# Patient Record
Sex: Female | Born: 1950 | Race: White | Hispanic: No | Marital: Married | Smoking: Never smoker
Health system: Southern US, Community
[De-identification: ages and names within clinical notes are randomized; demographics above are authoritative.]

## PROBLEM LIST (undated history)

## (undated) DIAGNOSIS — J302 Other seasonal allergic rhinitis: Secondary | ICD-10-CM

## (undated) DIAGNOSIS — IMO0002 Reserved for concepts with insufficient information to code with codable children: Secondary | ICD-10-CM

## (undated) DIAGNOSIS — M549 Dorsalgia, unspecified: Secondary | ICD-10-CM

## (undated) DIAGNOSIS — I10 Essential (primary) hypertension: Secondary | ICD-10-CM

## (undated) DIAGNOSIS — G8929 Other chronic pain: Secondary | ICD-10-CM

## (undated) DIAGNOSIS — C801 Malignant (primary) neoplasm, unspecified: Secondary | ICD-10-CM

## (undated) DIAGNOSIS — J189 Pneumonia, unspecified organism: Secondary | ICD-10-CM

## (undated) HISTORY — PX: ABDOMINAL HYSTERECTOMY: SHX81

## (undated) HISTORY — PX: TONSILLECTOMY: SUR1361

## (undated) HISTORY — PX: KNEE SURGERY: SHX244

## (undated) HISTORY — PX: HAND SURGERY: SHX662

---

## 2003-08-12 ENCOUNTER — Emergency Department (HOSPITAL_COMMUNITY): Admission: AD | Admit: 2003-08-12 | Discharge: 2003-08-13 | Payer: Self-pay | Admitting: Emergency Medicine

## 2004-09-20 ENCOUNTER — Emergency Department (HOSPITAL_COMMUNITY): Admission: EM | Admit: 2004-09-20 | Discharge: 2004-09-21 | Payer: Self-pay | Admitting: Emergency Medicine

## 2006-09-06 ENCOUNTER — Emergency Department (HOSPITAL_COMMUNITY): Admission: EM | Admit: 2006-09-06 | Discharge: 2006-09-06 | Payer: Self-pay | Admitting: Emergency Medicine

## 2007-01-24 ENCOUNTER — Emergency Department (HOSPITAL_COMMUNITY): Admission: EM | Admit: 2007-01-24 | Discharge: 2007-01-24 | Payer: Self-pay | Admitting: Emergency Medicine

## 2008-05-11 ENCOUNTER — Emergency Department (HOSPITAL_COMMUNITY): Admission: EM | Admit: 2008-05-11 | Discharge: 2008-05-12 | Payer: Self-pay | Admitting: Emergency Medicine

## 2008-06-10 ENCOUNTER — Emergency Department (HOSPITAL_COMMUNITY): Admission: EM | Admit: 2008-06-10 | Discharge: 2008-06-10 | Payer: Self-pay | Admitting: Emergency Medicine

## 2008-06-17 ENCOUNTER — Ambulatory Visit (HOSPITAL_COMMUNITY): Admission: RE | Admit: 2008-06-17 | Discharge: 2008-06-17 | Payer: Self-pay | Admitting: Gastroenterology

## 2008-12-01 ENCOUNTER — Emergency Department (HOSPITAL_COMMUNITY): Admission: EM | Admit: 2008-12-01 | Discharge: 2008-12-02 | Payer: Self-pay | Admitting: Emergency Medicine

## 2009-04-21 ENCOUNTER — Emergency Department (HOSPITAL_COMMUNITY): Admission: EM | Admit: 2009-04-21 | Discharge: 2009-04-21 | Payer: Self-pay | Admitting: Emergency Medicine

## 2009-06-16 ENCOUNTER — Emergency Department (HOSPITAL_COMMUNITY): Admission: EM | Admit: 2009-06-16 | Discharge: 2009-06-16 | Payer: Self-pay | Admitting: Emergency Medicine

## 2009-08-17 ENCOUNTER — Emergency Department (HOSPITAL_COMMUNITY): Admission: EM | Admit: 2009-08-17 | Discharge: 2009-08-17 | Payer: Self-pay | Admitting: Emergency Medicine

## 2009-11-06 ENCOUNTER — Emergency Department (HOSPITAL_COMMUNITY): Admission: EM | Admit: 2009-11-06 | Discharge: 2009-11-06 | Payer: Self-pay | Admitting: Emergency Medicine

## 2010-01-06 IMAGING — CT CT ABDOMEN W/ CM
3 of 5 series · 15 of 32 positions shown, 19 images · IV contrast (water & 100 ML OMNI 300)
Comparison: 06/10/2008

CT ABDOMEN

CLINICAL DATA: Right upper abdominal pain.  Nausea.

CT ABDOMEN AND PELVIS WITH CONTRAST
TECHNIQUE: Multidetector CT imaging of the abdomen and pelvis was
performed using the standard protocol following bolus
administration of intravenous contrast.
Contrast: 100 ml [id number]

[Series 2: routine abdomen · axial · 0.94mm/px · z∈[-432,-182]mm · 3 of 102 slices shown, 7 images]
[im 26/102  soft-tissue]
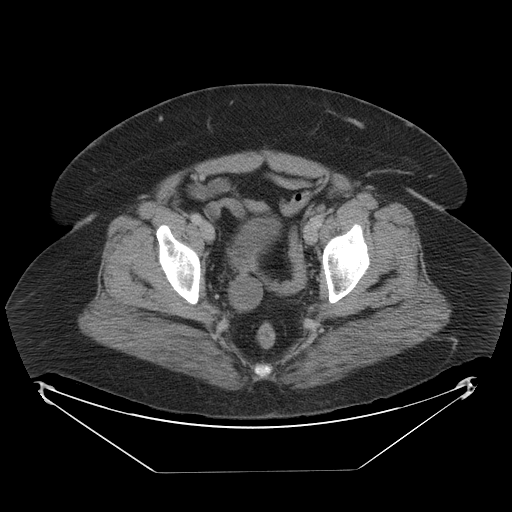
[im 26/102  lung]
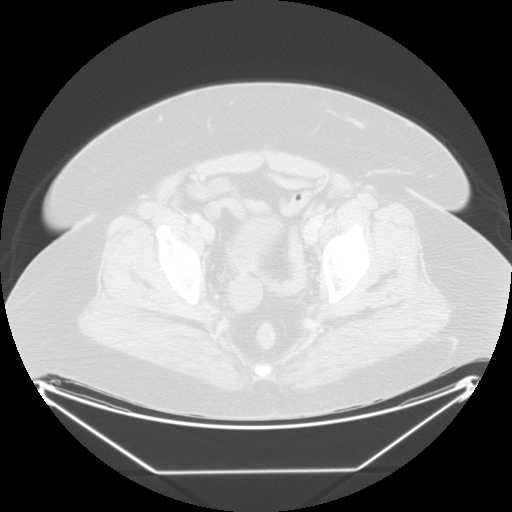
[im 26/102  bone]
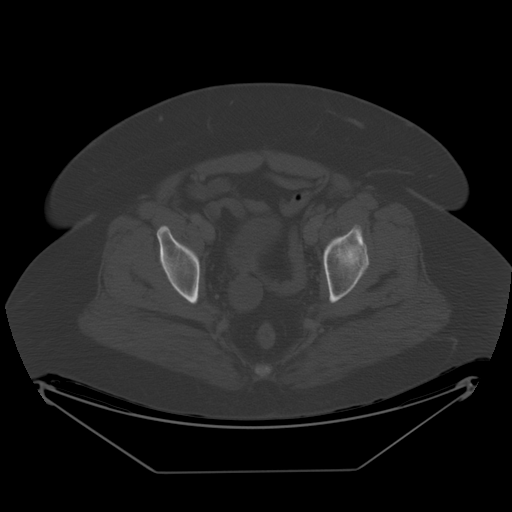
[im 51/102  soft-tissue]
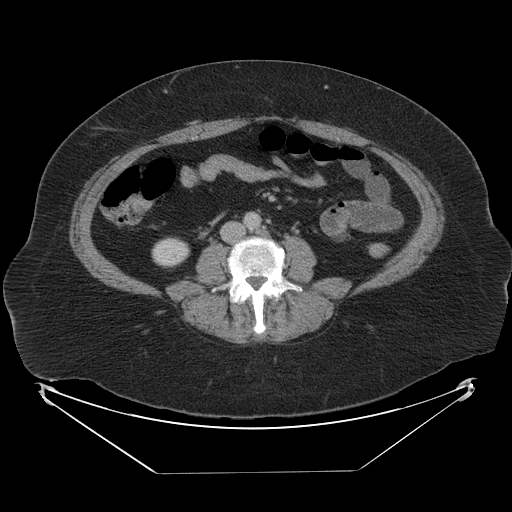
[im 51/102  lung]
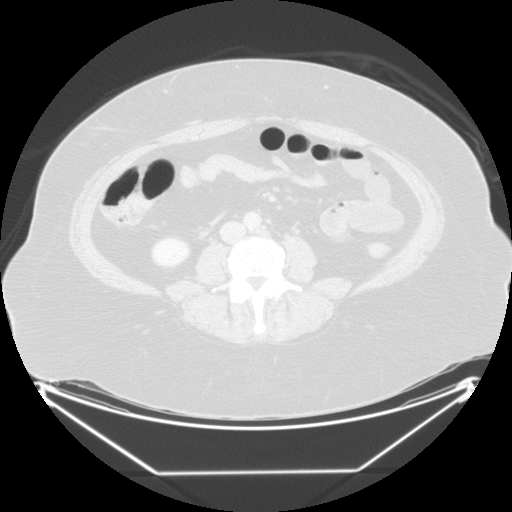
[im 76/102  soft-tissue]
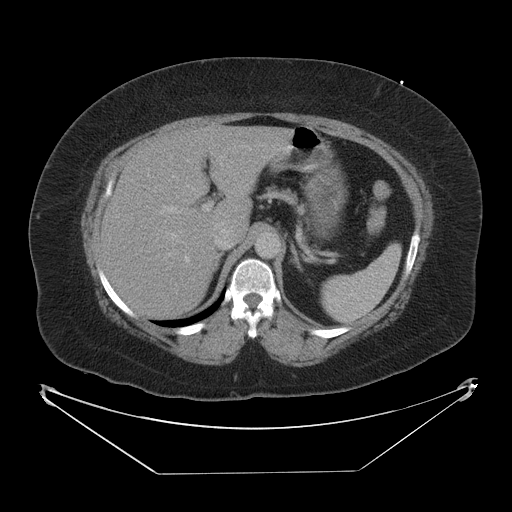
[im 76/102  lung]
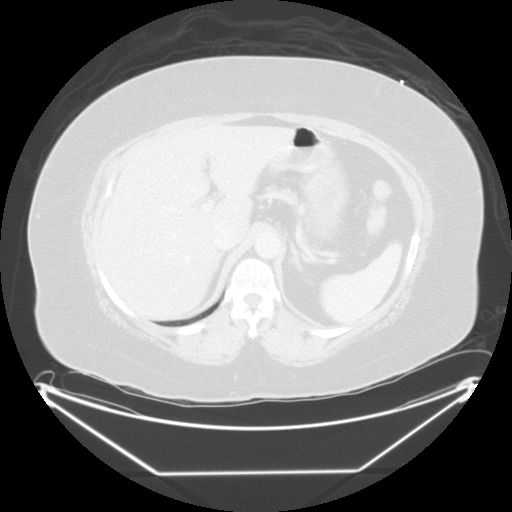

[Series 400: reformatted · sagittal · 1.01mm/px · 8 of 234 slices shown (1 of 2)]
[im 20/234  soft-tissue]
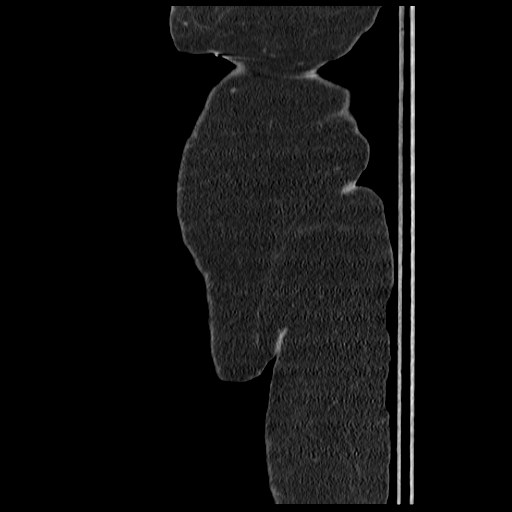
[im 59/234  soft-tissue]
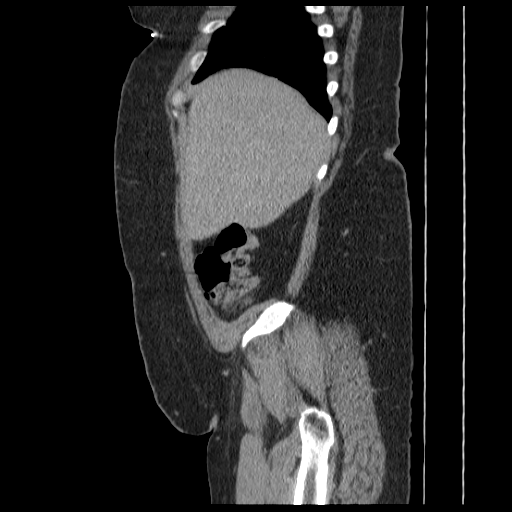
[im 78/234  soft-tissue]
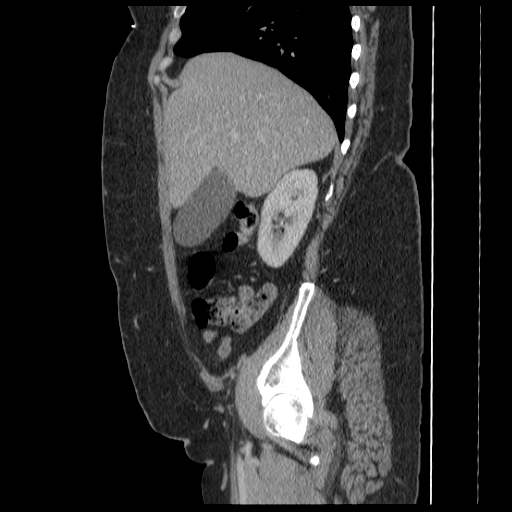
[im 98/234  soft-tissue]
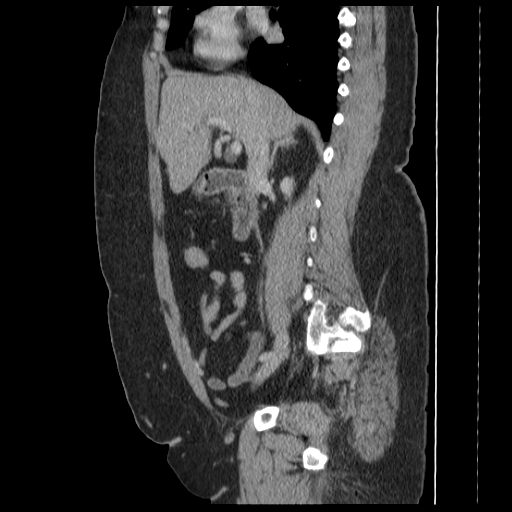
[im 136/234  soft-tissue]
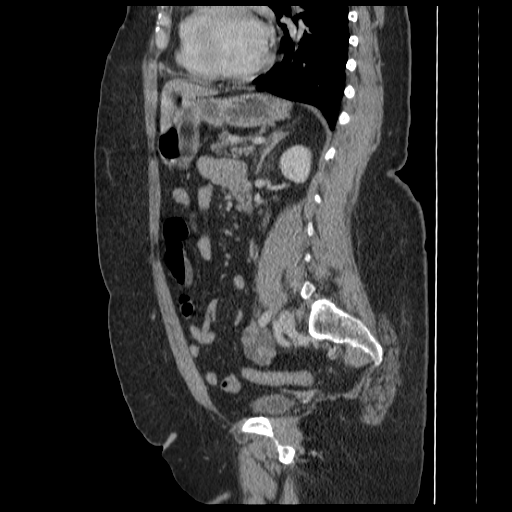
[im 156/234  soft-tissue]
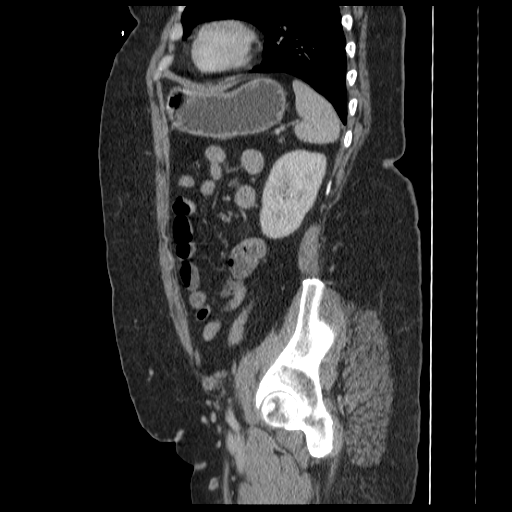
[im 175/234  soft-tissue]
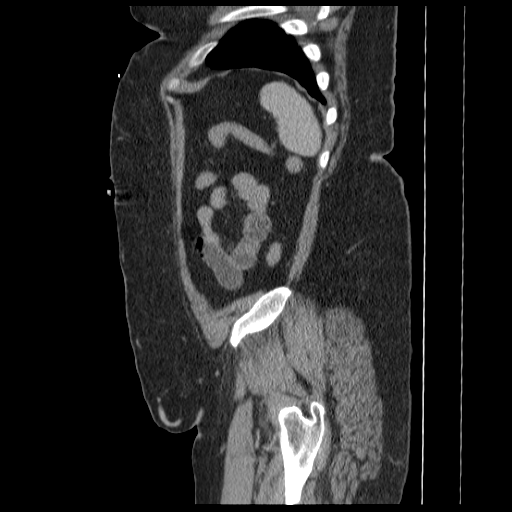
[im 214/234  soft-tissue]
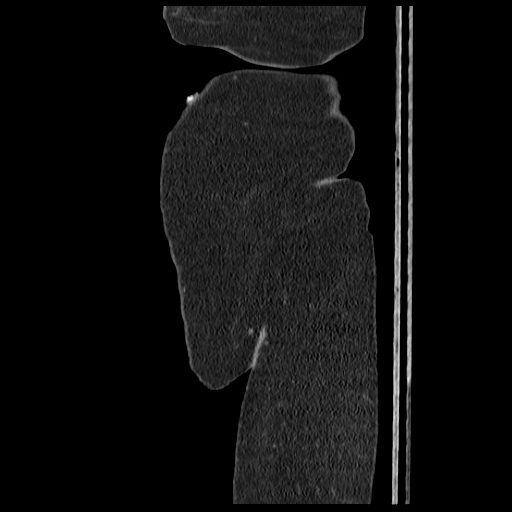

[Series 401: reformatted · coronal · 1.01mm/px · 4 of 185 slices shown (2 of 2)]
[im 21/185  soft-tissue]
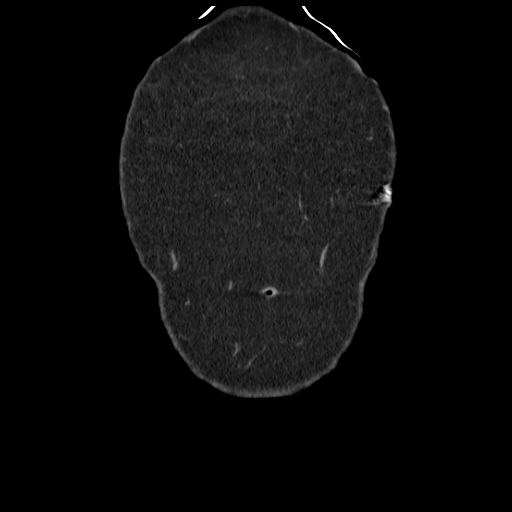
[im 41/185  soft-tissue]
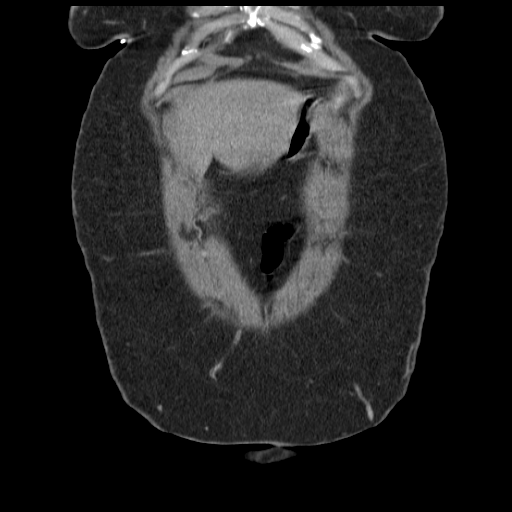
[im 62/185  soft-tissue]
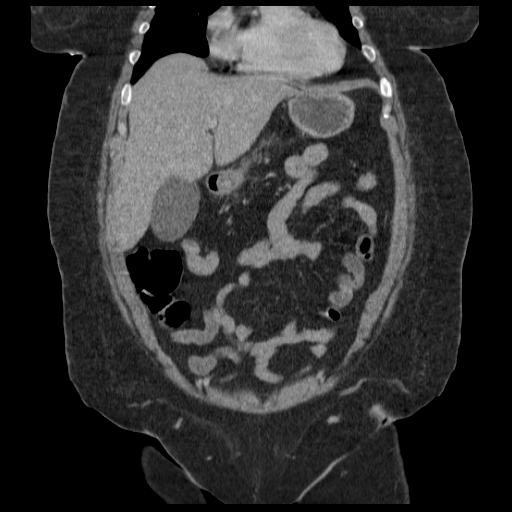
[im 82/185  soft-tissue]
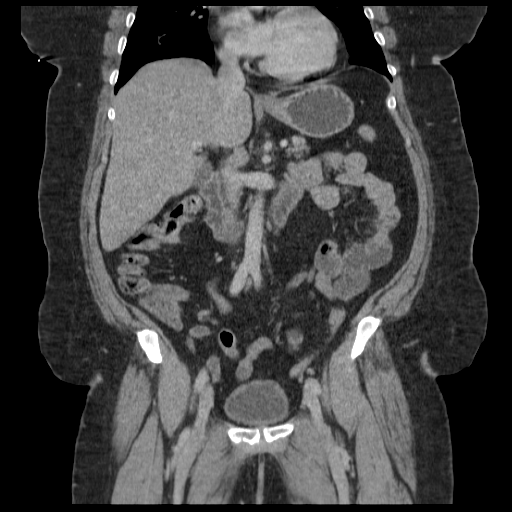

[15 of 32 positions shown; findings below may reference images not displayed]

FINDINGS: The liver, spleen, pancreas, adrenal glands, and kidneys
are normal except for a tiny stone in the upper pole of the left
kidney.  There is no hydronephrosis.  There is no free air or free
fluid.  No significant bony abnormality.
IMPRESSION: Tiny stone in the upper pole of the left kidney.  Otherwise benign-
appearing abdomen.

CT PELVIS
FINDINGS: There are small stable cysts on both ovaries, unchanged
since 06/10/2008.  The uterus has been removed.  The appendix and
terminal ileum are normal.  No diverticular disease.
IMPRESSION: Benign-appearing abdomen.

## 2010-01-06 IMAGING — US US ABDOMEN COMPLETE
1 series · 13 of 25 positions shown · non-contrast
Comparison: None

CLINICAL DATA: Nausea, vomiting and pain.

ABDOMINAL ULTRASOUND COMPLETE

[Series 1: us abdomen complete · 0.30mm/px · 13 of 76 slices shown]
[im 1/76]
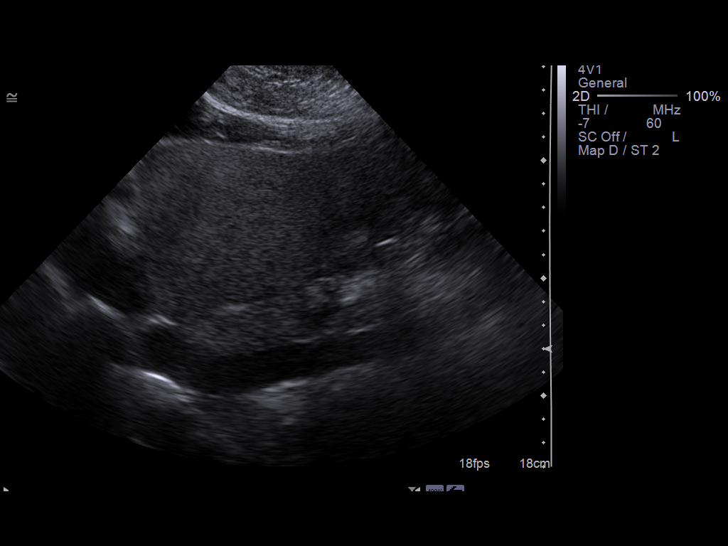
[im 7/76]
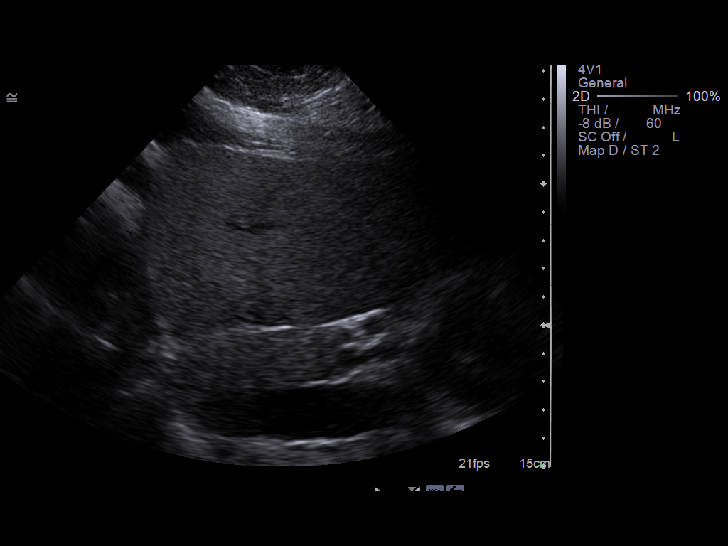
[im 13/76]
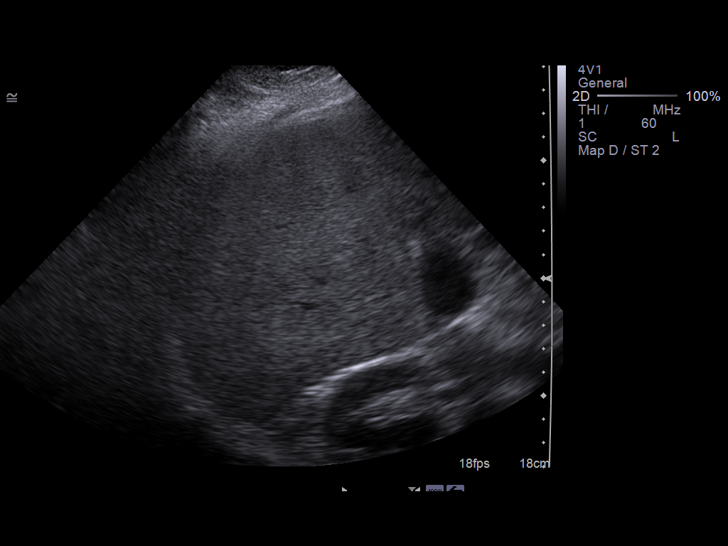
[im 19/76]
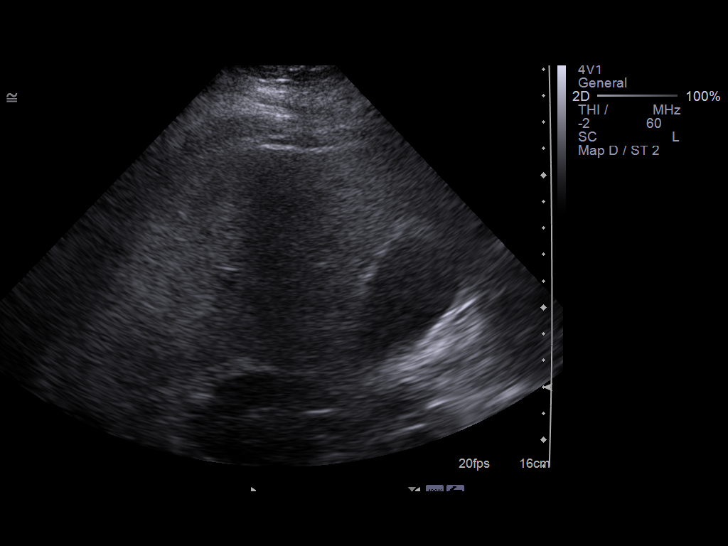
[im 26/76]
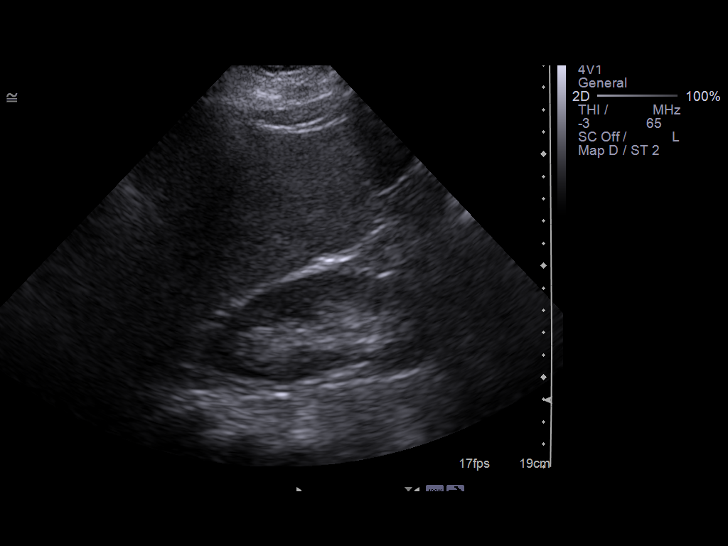
[im 32/76]
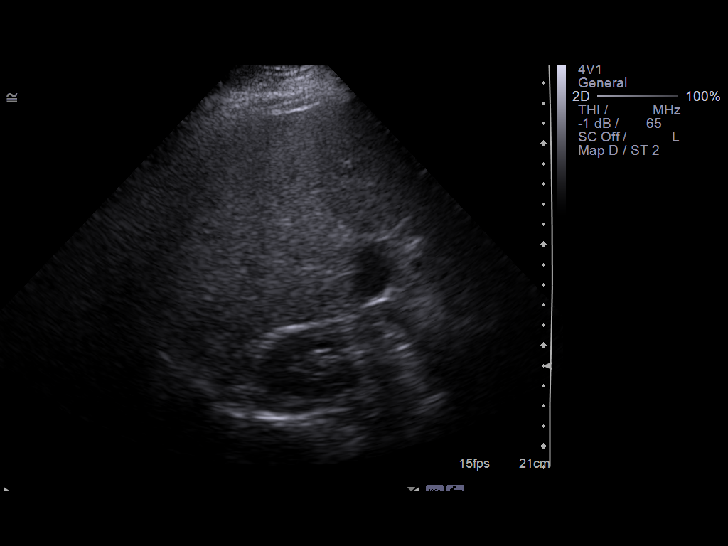
[im 38/76]
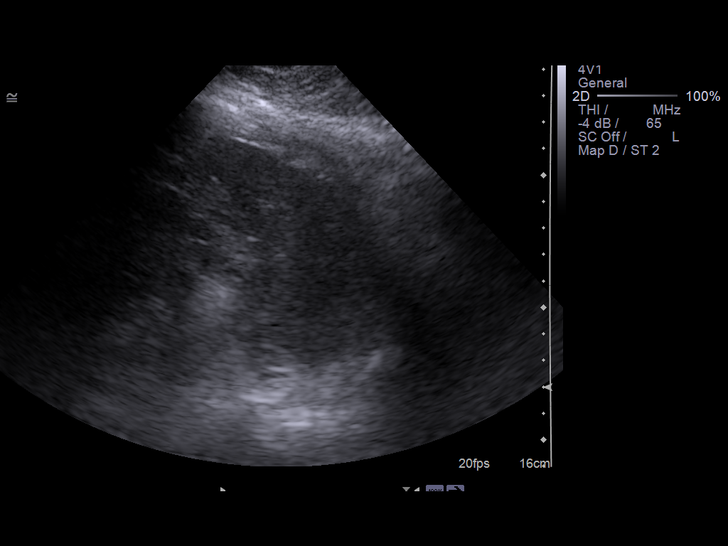
[im 44/76]
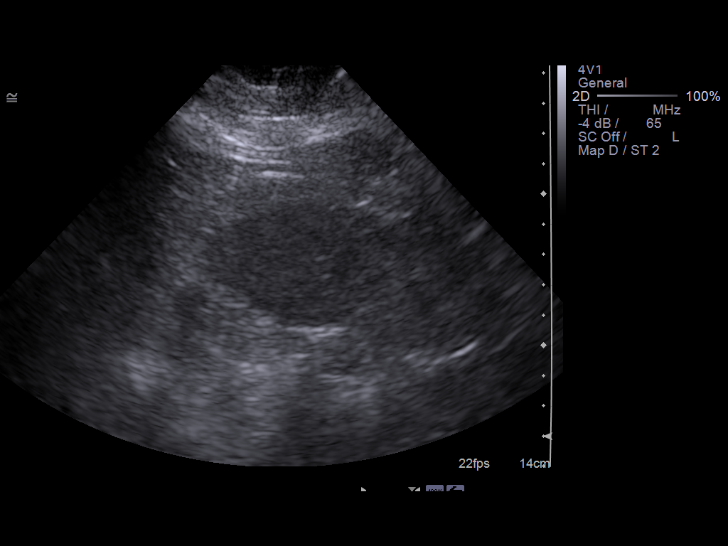
[im 51/76]
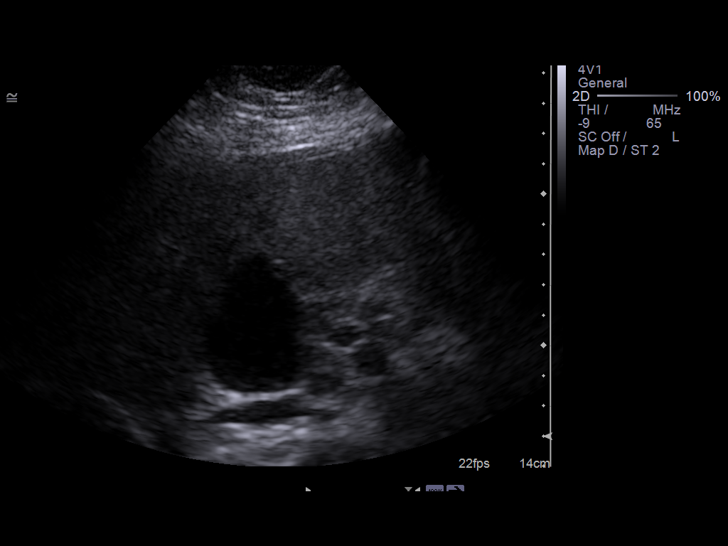
[im 57/76]
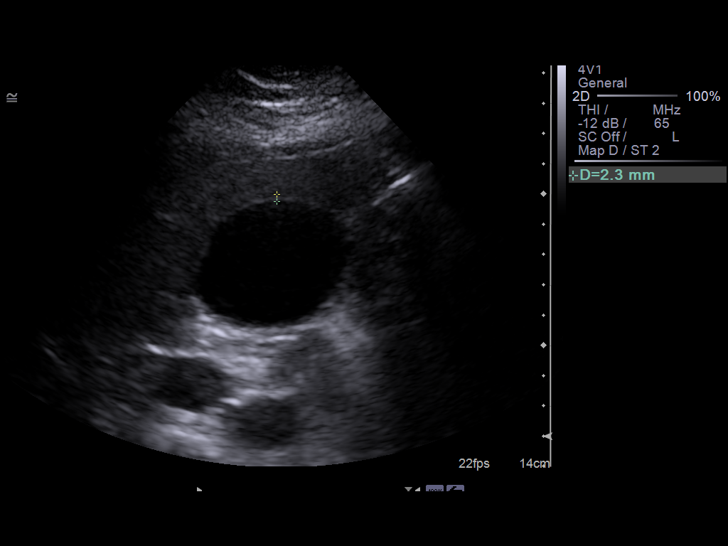
[im 63/76]
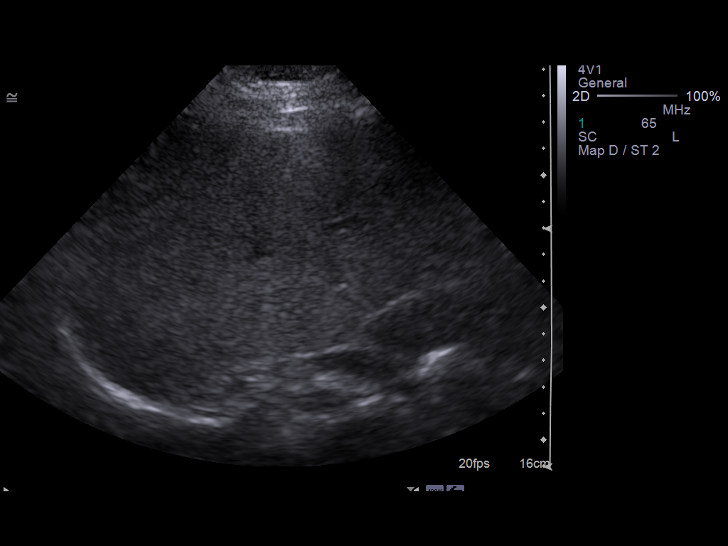
[im 69/76]
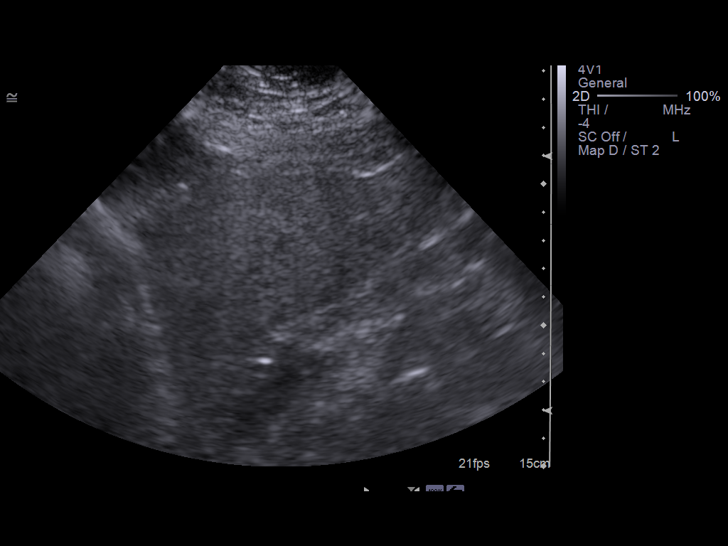
[im 76/76]
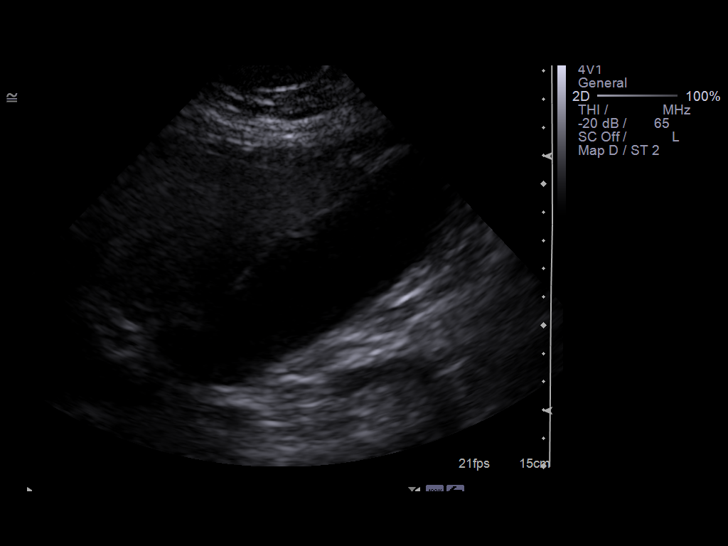

[13 of 25 positions shown; findings below may reference images not displayed]

FINDINGS: Please note that there is suboptimal visualization of many of the
abdominal organs due to patient body habitus and overlying bowel
gas.

Gallbladder:  No gallstones, gallbladder wall thickening, or
pericholecystic fluid.  A tiny amount of gallbladder sludge is
noted.

Common Bile Duct:  The CBD is upper limits of normal measuring 8 mm
in diameter. No definite obstructing cause is identified.

Liver:  No focal lesion identified.  Mild increased echogenicity of
the liver is compatible with fatty infiltration.

IVC:  Appears normal.

Pancreas:  Although the pancreas is difficult to visualize in its
entirety, no focal pancreatic abnormality is identified.

Spleen:  Within normal limits in size and echotexture.

Right kidney:  Normal in size and parenchymal echogenicity.  No
evidence of mass or hydronephrosis.

Left kidney:  Normal in size and parenchymal echogenicity.  No
evidence of mass or hydronephrosis.

Abdominal Aorta:  No aneurysm identified.

There is no evidence of ascites.
IMPRESSION: Suboptimal evaluation secondary to patient body habitus and bowel
gas.

Upper limits of normal CBD caliber without obstructing cause
identified.

Tiny amount of gallbladder sludge without cholelithiasis or
cholecystitis.

Fatty infiltration of the liver.

## 2010-01-08 ENCOUNTER — Emergency Department (HOSPITAL_COMMUNITY): Admission: EM | Admit: 2010-01-08 | Discharge: 2010-01-08 | Payer: Self-pay | Admitting: Emergency Medicine

## 2010-07-22 ENCOUNTER — Ambulatory Visit: Payer: 59 | Attending: Gynecologic Oncology | Admitting: Gynecologic Oncology

## 2010-07-22 DIAGNOSIS — R109 Unspecified abdominal pain: Secondary | ICD-10-CM | POA: Insufficient documentation

## 2010-07-22 DIAGNOSIS — R142 Eructation: Secondary | ICD-10-CM | POA: Insufficient documentation

## 2010-07-22 DIAGNOSIS — M545 Low back pain, unspecified: Secondary | ICD-10-CM | POA: Insufficient documentation

## 2010-07-22 DIAGNOSIS — Z802 Family history of malignant neoplasm of other respiratory and intrathoracic organs: Secondary | ICD-10-CM | POA: Insufficient documentation

## 2010-07-22 DIAGNOSIS — R141 Gas pain: Secondary | ICD-10-CM | POA: Insufficient documentation

## 2010-07-22 DIAGNOSIS — Z79899 Other long term (current) drug therapy: Secondary | ICD-10-CM | POA: Insufficient documentation

## 2010-07-22 DIAGNOSIS — N9489 Other specified conditions associated with female genital organs and menstrual cycle: Secondary | ICD-10-CM | POA: Insufficient documentation

## 2010-07-22 DIAGNOSIS — Z90710 Acquired absence of both cervix and uterus: Secondary | ICD-10-CM | POA: Insufficient documentation

## 2010-07-22 DIAGNOSIS — R143 Flatulence: Secondary | ICD-10-CM | POA: Insufficient documentation

## 2010-07-22 DIAGNOSIS — E669 Obesity, unspecified: Secondary | ICD-10-CM | POA: Insufficient documentation

## 2010-07-22 DIAGNOSIS — I10 Essential (primary) hypertension: Secondary | ICD-10-CM | POA: Insufficient documentation

## 2010-07-31 ENCOUNTER — Encounter (HOSPITAL_COMMUNITY): Payer: Medicare Other | Attending: Gynecologic Oncology

## 2010-07-31 ENCOUNTER — Other Ambulatory Visit: Payer: Self-pay | Admitting: Gynecologic Oncology

## 2010-07-31 DIAGNOSIS — N9489 Other specified conditions associated with female genital organs and menstrual cycle: Secondary | ICD-10-CM | POA: Insufficient documentation

## 2010-07-31 DIAGNOSIS — Z01812 Encounter for preprocedural laboratory examination: Secondary | ICD-10-CM | POA: Insufficient documentation

## 2010-07-31 LAB — CBC
Hemoglobin: 9.5 g/dL — ABNORMAL LOW (ref 12.0–15.0)
Platelets: 286 10*3/uL (ref 150–400)
RBC: 4.07 MIL/uL (ref 3.87–5.11)

## 2010-07-31 LAB — COMPREHENSIVE METABOLIC PANEL
ALT: 21 U/L (ref 0–35)
AST: 29 U/L (ref 0–37)
Alkaline Phosphatase: 110 U/L (ref 39–117)
CO2: 32 mEq/L (ref 19–32)
Calcium: 9.4 mg/dL (ref 8.4–10.5)
GFR calc Af Amer: >60 mL/min (ref >60)
Glucose, Bld: 91 mg/dL (ref 70–99)
Potassium: 4.2 mEq/L (ref 3.5–5.1)
Sodium: 135 mEq/L (ref 135–145)
Total Protein: 7.3 g/dL (ref 6.0–8.3)

## 2010-07-31 LAB — SURGICAL PCR SCREEN
MRSA, PCR: NEGATIVE
Staphylococcus aureus: NEGATIVE

## 2010-07-31 LAB — DIFFERENTIAL
Basophils Relative: 1 % (ref 0–1)
Eosinophils Absolute: 0.3 10*3/uL (ref 0.0–0.7)
Lymphs Abs: 2.5 10*3/uL (ref 0.7–4.0)
Monocytes Relative: 10 % (ref 3–12)
Neutro Abs: 2.9 10*3/uL (ref 1.7–7.7)
Neutrophils Relative %: 45 % (ref 43–77)

## 2010-08-04 ENCOUNTER — Ambulatory Visit (HOSPITAL_COMMUNITY)
Admission: RE | Admit: 2010-08-04 | Discharge: 2010-08-04 | Disposition: A | Discharge status: Home / Self Care | Payer: 59 | Source: Ambulatory Visit | Attending: Gynecologic Oncology | Admitting: Gynecologic Oncology

## 2010-08-04 DIAGNOSIS — Z01812 Encounter for preprocedural laboratory examination: Secondary | ICD-10-CM | POA: Insufficient documentation

## 2010-08-04 LAB — TYPE AND SCREEN: DAT, IgG: POSITIVE

## 2010-08-04 LAB — ABO/RH

## 2010-08-18 ENCOUNTER — Other Ambulatory Visit (HOSPITAL_COMMUNITY): Payer: 59

## 2010-08-20 ENCOUNTER — Encounter (HOSPITAL_COMMUNITY): Payer: Medicare Other

## 2010-08-20 ENCOUNTER — Other Ambulatory Visit: Payer: Self-pay | Admitting: Gynecologic Oncology

## 2010-08-20 LAB — BASIC METABOLIC PANEL
CO2: 29 mEq/L (ref 19–32)
Chloride: 103 mEq/L (ref 96–112)
GFR calc Af Amer: >60 mL/min (ref >60)
Glucose, Bld: 113 mg/dL — ABNORMAL HIGH (ref 70–99)
Potassium: 4 mEq/L (ref 3.5–5.1)
Sodium: 140 mEq/L (ref 135–145)

## 2010-08-20 LAB — CBC
HCT: 36.7 % (ref 36.0–46.0)
Hemoglobin: 10.5 g/dL — ABNORMAL LOW (ref 12.0–15.0)
RBC: 4.63 MIL/uL (ref 3.87–5.11)

## 2010-08-21 LAB — CBC
HCT: 37.4 % (ref 36.0–46.0)
Hemoglobin: 12.3 g/dL (ref 12.0–15.0)
MCH: 26.7 pg (ref 26.0–34.0)
MCHC: 32.9 g/dL (ref 30.0–36.0)
MCV: 81.1 fL (ref 78.0–100.0)
RBC: 4.61 MIL/uL (ref 3.87–5.11)

## 2010-08-21 LAB — LIPASE, BLOOD: Lipase: 18 U/L (ref 11–59)

## 2010-08-21 LAB — COMPREHENSIVE METABOLIC PANEL
AST: 26 U/L (ref 0–37)
CO2: 24 mEq/L (ref 19–32)
Chloride: 104 mEq/L (ref 96–112)
Creatinine, Ser: 0.78 mg/dL (ref 0.4–1.2)
GFR calc Af Amer: >60 mL/min (ref >60)
GFR calc non Af Amer: >60 mL/min (ref >60)
Glucose, Bld: 144 mg/dL — ABNORMAL HIGH (ref 70–99)
Total Bilirubin: 0.6 mg/dL (ref 0.3–1.2)

## 2010-08-21 LAB — DIFFERENTIAL
Basophils Absolute: 0 10*3/uL (ref 0.0–0.1)
Eosinophils Absolute: 0 10*3/uL (ref 0.0–0.7)
Eosinophils Relative: 0 % (ref 0–5)
Lymphocytes Relative: 15 % (ref 12–46)
Neutrophils Relative %: 82 % — ABNORMAL HIGH (ref 43–77)

## 2010-08-23 LAB — LIPASE, BLOOD: Lipase: 14 U/L (ref 11–59)

## 2010-08-23 LAB — COMPREHENSIVE METABOLIC PANEL
ALT: 11 U/L (ref 0–35)
Albumin: 3.6 g/dL (ref 3.5–5.2)
Alkaline Phosphatase: 99 U/L (ref 39–117)
GFR calc non Af Amer: >60 mL/min (ref >60)
Potassium: 3.2 mEq/L — ABNORMAL LOW (ref 3.5–5.1)
Sodium: 140 mEq/L (ref 135–145)
Total Bilirubin: 0.4 mg/dL (ref 0.3–1.2)
Total Protein: 7.4 g/dL (ref 6.0–8.3)

## 2010-08-23 LAB — DIFFERENTIAL
Basophils Absolute: 0 10*3/uL (ref 0.0–0.1)
Lymphocytes Relative: 19 % (ref 12–46)
Lymphs Abs: 1.4 10*3/uL (ref 0.7–4.0)
Neutro Abs: 5.8 10*3/uL (ref 1.7–7.7)
Neutrophils Relative %: 78 % — ABNORMAL HIGH (ref 43–77)

## 2010-08-23 LAB — URINALYSIS, ROUTINE W REFLEX MICROSCOPIC
Bilirubin Urine: NEGATIVE
Nitrite: NEGATIVE
Specific Gravity, Urine: 1.024 (ref 1.005–1.030)
Urobilinogen, UA: 0.2 mg/dL (ref 0.0–1.0)
pH: 7.5 (ref 5.0–8.0)

## 2010-08-23 LAB — CBC
Hemoglobin: 10.9 g/dL — ABNORMAL LOW (ref 12.0–15.0)
Platelets: 483 10*3/uL — ABNORMAL HIGH (ref 150–400)
RDW: 15.2 % (ref 11.5–15.5)
WBC: 7.5 10*3/uL (ref 4.0–10.5)

## 2010-08-24 LAB — BASIC METABOLIC PANEL
BUN: 6 mg/dL (ref 6–23)
CO2: 26 mEq/L (ref 19–32)
Calcium: 9.4 mg/dL (ref 8.4–10.5)
Creatinine, Ser: 0.8 mg/dL (ref 0.4–1.2)
GFR calc non Af Amer: >60 mL/min (ref >60)
Glucose, Bld: 144 mg/dL — ABNORMAL HIGH (ref 70–99)
Sodium: 140 mEq/L (ref 135–145)

## 2010-08-24 LAB — URINALYSIS, ROUTINE W REFLEX MICROSCOPIC
Bilirubin Urine: NEGATIVE
Glucose, UA: NEGATIVE mg/dL
Ketones, ur: NEGATIVE mg/dL
Protein, ur: >300 mg/dL — AB
Urobilinogen, UA: 0.2 mg/dL (ref 0.0–1.0)

## 2010-08-24 LAB — POCT CARDIAC MARKERS
CKMB, poc: <1 ng/mL — ABNORMAL LOW (ref 1.0–8.0)
CKMB, poc: <1 ng/mL — ABNORMAL LOW (ref 1.0–8.0)
Myoglobin, poc: 58.3 ng/mL (ref 12–200)

## 2010-08-24 LAB — COMPREHENSIVE METABOLIC PANEL
ALT: 12 U/L (ref 0–35)
AST: 21 U/L (ref 0–37)
Alkaline Phosphatase: 104 U/L (ref 39–117)
CO2: 20 mEq/L (ref 19–32)
Calcium: 9.7 mg/dL (ref 8.4–10.5)
GFR calc Af Amer: >60 mL/min (ref >60)
Glucose, Bld: 139 mg/dL — ABNORMAL HIGH (ref 70–99)
Potassium: 3.2 mEq/L — ABNORMAL LOW (ref 3.5–5.1)
Sodium: 137 mEq/L (ref 135–145)
Total Protein: 8.2 g/dL (ref 6.0–8.3)

## 2010-08-24 LAB — DIFFERENTIAL
Basophils Relative: 1 % (ref 0–1)
Eosinophils Absolute: 0 10*3/uL (ref 0.0–0.7)
Eosinophils Relative: 1 % (ref 0–5)
Lymphs Abs: 1.4 10*3/uL (ref 0.7–4.0)
Monocytes Relative: 3 % (ref 3–12)

## 2010-08-24 LAB — CBC
Hemoglobin: 13.4 g/dL (ref 12.0–15.0)
MCHC: 32.9 g/dL (ref 30.0–36.0)
RBC: 4.84 MIL/uL (ref 3.87–5.11)

## 2010-08-24 LAB — URINE CULTURE
Colony Count: NO GROWTH
Culture: NO GROWTH

## 2010-08-24 LAB — URINE MICROSCOPIC-ADD ON

## 2010-08-24 LAB — LACTIC ACID, PLASMA: Lactic Acid, Venous: 3.4 mmol/L — ABNORMAL HIGH (ref 0.5–2.2)

## 2010-08-25 ENCOUNTER — Other Ambulatory Visit: Payer: Self-pay | Admitting: Gynecologic Oncology

## 2010-08-25 ENCOUNTER — Ambulatory Visit (HOSPITAL_COMMUNITY)
Admission: RE | Admit: 2010-08-25 | Discharge: 2010-08-26 | Disposition: A | Discharge status: Home / Self Care | Payer: Medicare Other | Source: Ambulatory Visit | Attending: Obstetrics & Gynecology | Admitting: Obstetrics & Gynecology

## 2010-08-25 DIAGNOSIS — D279 Benign neoplasm of unspecified ovary: Secondary | ICD-10-CM | POA: Insufficient documentation

## 2010-08-25 DIAGNOSIS — Z01811 Encounter for preprocedural respiratory examination: Secondary | ICD-10-CM | POA: Insufficient documentation

## 2010-08-25 DIAGNOSIS — Z01812 Encounter for preprocedural laboratory examination: Secondary | ICD-10-CM | POA: Insufficient documentation

## 2010-08-25 DIAGNOSIS — I10 Essential (primary) hypertension: Secondary | ICD-10-CM | POA: Insufficient documentation

## 2010-08-25 DIAGNOSIS — Z79899 Other long term (current) drug therapy: Secondary | ICD-10-CM | POA: Insufficient documentation

## 2010-08-25 DIAGNOSIS — D649 Anemia, unspecified: Secondary | ICD-10-CM | POA: Insufficient documentation

## 2010-08-25 DIAGNOSIS — R1909 Other intra-abdominal and pelvic swelling, mass and lump: Secondary | ICD-10-CM | POA: Insufficient documentation

## 2010-08-26 LAB — CBC
HCT: 32 % — ABNORMAL LOW (ref 36.0–46.0)
Hemoglobin: 9.4 g/dL — ABNORMAL LOW (ref 12.0–15.0)
MCH: 23.5 pg — ABNORMAL LOW (ref 26.0–34.0)
MCHC: 29.4 g/dL — ABNORMAL LOW (ref 30.0–36.0)
RBC: 4 MIL/uL (ref 3.87–5.11)

## 2010-08-26 LAB — CROSSMATCH
ABO/RH(D): O POS
DAT, IgG: POSITIVE

## 2010-08-26 NOTE — Op Note (Signed)
Tara Conley, Tara Conley     ACCOUNT NO.:  [id number]  MEDICAL RECORD NO.:  [id number]           PATIENT TYPE:  O  LOCATION:  1529                         FACILITY:  [facility]  PHYSICIAN:  Laurette Schimke, MD     DATE OF BIRTH:  22-Jun-1950  DATE OF PROCEDURE:  08/25/2010 DATE OF DISCHARGE:                              OPERATIVE REPORT   PREOPERATIVE DIAGNOSIS:  Bilateral pelvic masses.  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  PROCEDURE:  Robotic lysis of adhesions, bilateral salpingo-oophorectomy, infracolic omentectomy, and minilaparotomy for specimen removal.  SURGEON:  Laurette Schimke, M.D.  ASSISTANTS: 1. Roseanna Rainbow, M.D. 2. Telford Nab, R.N.  FINDINGS:  Upon entrance to the abdomen, bilateral masses were noted to be beneath adhesions of the colon to the bladder.  The omentum was noted to be within normal limits, very significant bilateral pelvic sidewall adhesions.  DESCRIPTION OF PROCEDURE:  The patient was taken to the operating room, placed on general tracheal anesthesia without any difficulty, was placed in dorsal lithotomy position, prepped and draped in the usual sterile fashion.  Sponge stick was placed in the vagina and incision was made in the left upper quadrant just inferior to the costal margin.  Under the direct visualization, the upper abdomen was entered and the abdomen insufflated.  Incisions were made just superior to the umbilicus and 10 cm lateral to the umbilicus.  Left and right trocar ports were placed in each and the robot docked.  The patient was noted to have bilateral adnexa that were inferior to the adhesions on the bilateral pelvic sidewall.  The adhesions from the rectosigmoid to the bladder was sharply transected.  The right adnexal mass was visualized and the utero- ovarian infundibulopelvic ligament  identified, skeletonized, and the ureter identified.  The specimen was cauterized and resected.  The mass was dissected from its adhesions to the pelvic side wall into the vaginal cuff.  The specimen was placed in the bag and was noted to have excrescences on the mass. An attempt was made to deliver the mass from the left upper quadrant incision, but this was not feasible given the patient's abdominal girth. As such, the mass was placed in another Endopouch bag and returned down to the pelvic cavity.  The rectosigmoid was dissected sharply from its attachments to the pelvic sidewall.  The infundibulopelvic ligament was identified, skeletonized, isolated, cauterized, and transected.  The mass was dissected free from its attachments to the posterior cul-de-sac and to the vaginal cuff.  This was placed in an Endopouch bag.  The robot was undocked and the specimens were delivered through the umbilical incision and sent to Pathology.  The frozen section returned evidence of a right dermoid and evidence of a right lobe malignant potential tumor.  The left adnexa was noted to be a cystadenofibroma.  Of note, pelvic washings were obtained at the beginning of the procedure.  It was then determined that an omentectomy was necessary.  The robot was redocked and trocar sites evaluated.  The omentum was transected off the transverse colon with use of a LigaSure. Additional hemostasis was  necessary, was obtained with the use of direct Bovie cautery.  The omentum was placed in the spleen bag and the robot was then undocked.  Throughout the procedure, abdominal pressures were maintained at 15 mmHg.  Because of prior specimen removal through the umbilicus, maintenance of intra-abdominal pressure was challenging, this lengthened the procedure because of intermittent decreased visualization.  The robot was undocked.  Minilaparotomy was made at the level of the umbilicus and the and the omentum delivered  through this site within the spleen bag.  The incision sites were irrigated and drained.  The fascia of the umbilical port site was closed with a running Vicryl suture and subcutaneous sutures were placed at all other laparoscopic port sites.  The incision sites were copiously irrigated and drained and the skin was closed with running Vicryl subcuticular suture.  Sponge, instrument, and needle counts were correct x3.  DISPOSITION:  The patient was taken to the recovery room in stable condition.     Laurette Schimke, MD     WB/MEDQ  D:  08/25/2010  T:  08/26/2010  Job:  [id number]  cc:   Telford Nab, R.N. [address] [place], [place] [address]  Roseanna Rainbow, M.D. Fax: [phone]  Arlyce Harman, MD  Electronically Signed by Laurette Schimke MD on 08/26/2010 02:18:45 PM

## 2010-08-30 LAB — URINE MICROSCOPIC-ADD ON

## 2010-08-30 LAB — COMPREHENSIVE METABOLIC PANEL
ALT: 14 U/L (ref 0–35)
AST: 22 U/L (ref 0–37)
Alkaline Phosphatase: 110 U/L (ref 39–117)
CO2: 25 mEq/L (ref 19–32)
Chloride: 104 mEq/L (ref 96–112)
GFR calc Af Amer: >60 mL/min (ref >60)
GFR calc non Af Amer: >60 mL/min (ref >60)
Glucose, Bld: 150 mg/dL — ABNORMAL HIGH (ref 70–99)
Potassium: 3.4 mEq/L — ABNORMAL LOW (ref 3.5–5.1)
Sodium: 139 mEq/L (ref 135–145)
Total Bilirubin: 0.6 mg/dL (ref 0.3–1.2)

## 2010-08-30 LAB — POCT CARDIAC MARKERS
CKMB, poc: <1 ng/mL — ABNORMAL LOW (ref 1.0–8.0)
Myoglobin, poc: 59.3 ng/mL (ref 12–200)

## 2010-08-30 LAB — URINALYSIS, ROUTINE W REFLEX MICROSCOPIC
Bilirubin Urine: NEGATIVE
Ketones, ur: 15 mg/dL — AB
Nitrite: NEGATIVE
Protein, ur: 30 mg/dL — AB
pH: 8.5 — ABNORMAL HIGH (ref 5.0–8.0)

## 2010-08-30 LAB — DIFFERENTIAL
Basophils Absolute: 0 10*3/uL (ref 0.0–0.1)
Basophils Relative: 0 % (ref 0–1)
Eosinophils Absolute: 0 10*3/uL (ref 0.0–0.7)
Eosinophils Relative: 0 % (ref 0–5)
Lymphs Abs: 1.6 10*3/uL (ref 0.7–4.0)

## 2010-08-30 LAB — LIPASE, BLOOD: Lipase: 16 U/L (ref 11–59)

## 2010-08-30 LAB — CBC
Hemoglobin: 12.9 g/dL (ref 12.0–15.0)
RBC: 4.53 MIL/uL (ref 3.87–5.11)
WBC: 10.1 10*3/uL (ref 4.0–10.5)

## 2010-09-09 LAB — DIFFERENTIAL
Basophils Absolute: 0 10*3/uL (ref 0.0–0.1)
Lymphocytes Relative: 10 % — ABNORMAL LOW (ref 12–46)
Neutro Abs: 11.9 10*3/uL — ABNORMAL HIGH (ref 1.7–7.7)

## 2010-09-09 LAB — POCT CARDIAC MARKERS
CKMB, poc: 1.5 ng/mL (ref 1.0–8.0)
Myoglobin, poc: 101 ng/mL (ref 12–200)
Myoglobin, poc: 88.1 ng/mL (ref 12–200)

## 2010-09-09 LAB — BASIC METABOLIC PANEL
BUN: 8 mg/dL (ref 6–23)
Calcium: 9.7 mg/dL (ref 8.4–10.5)
GFR calc non Af Amer: >60 mL/min (ref >60)
Glucose, Bld: 164 mg/dL — ABNORMAL HIGH (ref 70–99)
Sodium: 138 mEq/L (ref 135–145)

## 2010-09-09 LAB — HEPATIC FUNCTION PANEL
ALT: 17 U/L (ref 0–35)
AST: 47 U/L — ABNORMAL HIGH (ref 0–37)
Total Protein: 8.3 g/dL (ref 6.0–8.3)

## 2010-09-09 LAB — CBC
Platelets: 332 10*3/uL (ref 150–400)
RDW: 14.4 % (ref 11.5–15.5)

## 2010-09-17 ENCOUNTER — Ambulatory Visit: Payer: Medicare Other | Attending: Gynecologic Oncology | Admitting: Gynecologic Oncology

## 2010-09-17 DIAGNOSIS — Z90710 Acquired absence of both cervix and uterus: Secondary | ICD-10-CM | POA: Insufficient documentation

## 2010-09-17 DIAGNOSIS — Z9079 Acquired absence of other genital organ(s): Secondary | ICD-10-CM | POA: Insufficient documentation

## 2010-09-17 DIAGNOSIS — D279 Benign neoplasm of unspecified ovary: Secondary | ICD-10-CM | POA: Insufficient documentation

## 2010-09-21 LAB — POCT CARDIAC MARKERS
Myoglobin, poc: 61.1 ng/mL (ref 12–200)
Troponin i, poc: <0.05 ng/mL (ref 0.00–0.09)

## 2010-09-21 LAB — COMPREHENSIVE METABOLIC PANEL
AST: 24 U/L (ref 0–37)
BUN: 10 mg/dL (ref 6–23)
CO2: 24 mEq/L (ref 19–32)
Calcium: 9.9 mg/dL (ref 8.4–10.5)
Creatinine, Ser: 0.86 mg/dL (ref 0.4–1.2)
GFR calc Af Amer: >60 mL/min (ref >60)
GFR calc non Af Amer: >60 mL/min (ref >60)

## 2010-09-21 LAB — CBC
MCHC: 33.7 g/dL (ref 30.0–36.0)
MCV: 91.2 fL (ref 78.0–100.0)
Platelets: 346 10*3/uL (ref 150–400)
RBC: 5 MIL/uL (ref 3.87–5.11)

## 2010-09-21 LAB — URINALYSIS, ROUTINE W REFLEX MICROSCOPIC
Ketones, ur: 15 mg/dL — AB
Leukocytes, UA: NEGATIVE
Nitrite: NEGATIVE
Protein, ur: 100 mg/dL — AB
Urobilinogen, UA: 0.2 mg/dL (ref 0.0–1.0)

## 2010-09-21 LAB — LIPASE, BLOOD: Lipase: 20 U/L (ref 11–59)

## 2010-09-21 LAB — DIFFERENTIAL
Eosinophils Absolute: 0 10*3/uL (ref 0.0–0.7)
Lymphs Abs: 1.5 10*3/uL (ref 0.7–4.0)
Monocytes Relative: 1 % — ABNORMAL LOW (ref 3–12)
Neutro Abs: 9.9 10*3/uL — ABNORMAL HIGH (ref 1.7–7.7)
Neutrophils Relative %: 85 % — ABNORMAL HIGH (ref 43–77)

## 2010-09-21 NOTE — Consult Note (Signed)
  [name], [name] NO.:  [id number]  MEDICAL RECORD NO.:  [id number]           PATIENT TYPE:  LOCATION:                                 FACILITY:  PHYSICIAN:  Laurette Schimke, MD     DATE OF BIRTH:  April 13, 1951  DATE OF CONSULTATION:  09/17/2010 DATE OF DISCHARGE:                                CONSULTATION   REASON FOR VISIT:  Postoperative evaluation.  HISTORY OF PRESENT ILLNESS:  This is a 60 year old para 3, last normal menstrual period in 1978, at which time she underwent a hysterectomy. She has reported crampy lower abdominal pain for several years, once requiring hospitalization.  Evaluation by Dr. Neva Conley was notable for bilateral adnexal masses with a CA-125 of 4.  On August 25, 2010, she underwent a robotic laparoscopic, extensive adhesiolysis, bilateral salpingo-oophorectomy, infracolic omentectomy with mini-umbilical laparotomy for specimen removal.  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  The omentum was negative.  The left ovary showed ovarian serous cystadenofibroma.  Ms. Tara Conley has done very well since the procedure.  No nausea, vomiting, fever, or chills.  Denies abdominal pain, desires to resume sexual activity.  PAST MEDICAL HISTORY:  No interval changes.  PAST SURGICAL HISTORY:  No interval changes.  PHYSICAL EXAMINATION:  GENERAL:  A well-developed female in no acute distress. VITAL SIGNS:  Weight 264 pounds, blood pressure 128/68, pulse of 60. ABDOMEN:  Soft, nontender.  All laparoscopic and surgical sites are well healed.  There is reactive erythema.  No tenderness.  No evidence of a hernia. PELVIC:  No cul-de-sac fullness or tenderness.  IMPRESSION:  Tara Conley is status post bilateral salpingo- oophorectomy and omentectomy,  robotically performed for a right ovarian serous low-malignant potential tumor.  She is doing exceptionally well. She will follow up with Dr. Neva Conley in 6 months and follow up with GYN oncology team in 12 months.     Laurette Schimke, MD     WB/MEDQ  D:  09/17/2010  T:  09/18/2010  Job:  [id number]  cc:   Tara Harman, MD  Telford Nab, R.N. [address] [place], [place] [address]  Electronically Signed by Laurette Schimke MD on 09/21/2010 11:25:35 AM

## 2010-10-20 ENCOUNTER — Emergency Department (HOSPITAL_COMMUNITY): Payer: Medicare Other

## 2010-10-20 ENCOUNTER — Emergency Department (HOSPITAL_COMMUNITY)
Admission: EM | Admit: 2010-10-20 | Discharge: 2010-10-20 | Disposition: A | Discharge status: Home / Self Care | Payer: Medicare Other | Attending: Emergency Medicine | Admitting: Emergency Medicine

## 2010-10-20 DIAGNOSIS — R112 Nausea with vomiting, unspecified: Secondary | ICD-10-CM | POA: Insufficient documentation

## 2010-10-20 DIAGNOSIS — K219 Gastro-esophageal reflux disease without esophagitis: Secondary | ICD-10-CM | POA: Insufficient documentation

## 2010-10-20 DIAGNOSIS — R1013 Epigastric pain: Secondary | ICD-10-CM | POA: Insufficient documentation

## 2010-10-20 DIAGNOSIS — M546 Pain in thoracic spine: Secondary | ICD-10-CM | POA: Insufficient documentation

## 2010-10-20 DIAGNOSIS — I10 Essential (primary) hypertension: Secondary | ICD-10-CM | POA: Insufficient documentation

## 2010-10-20 DIAGNOSIS — I517 Cardiomegaly: Secondary | ICD-10-CM | POA: Insufficient documentation

## 2010-10-20 LAB — CBC
HCT: 33 % — ABNORMAL LOW (ref 36.0–46.0)
MCHC: 31.2 g/dL (ref 30.0–36.0)
MCV: 75 fL — ABNORMAL LOW (ref 78.0–100.0)
RDW: 18 % — ABNORMAL HIGH (ref 11.5–15.5)
WBC: 9.1 10*3/uL (ref 4.0–10.5)

## 2010-10-20 LAB — COMPREHENSIVE METABOLIC PANEL
Alkaline Phosphatase: 106 U/L (ref 39–117)
BUN: 16 mg/dL (ref 6–23)
GFR calc non Af Amer: >60 mL/min (ref >60)
Glucose, Bld: 95 mg/dL (ref 70–99)
Potassium: 4 mEq/L (ref 3.5–5.1)
Total Protein: 7.9 g/dL (ref 6.0–8.3)

## 2010-10-20 LAB — DIFFERENTIAL
Eosinophils Relative: 0 % (ref 0–5)
Lymphocytes Relative: 26 % (ref 12–46)
Lymphs Abs: 2.4 10*3/uL (ref 0.7–4.0)
Monocytes Absolute: 0.5 10*3/uL (ref 0.1–1.0)

## 2010-10-20 LAB — LIPASE, BLOOD: Lipase: 25 U/L (ref 11–59)

## 2010-10-22 ENCOUNTER — Emergency Department (HOSPITAL_COMMUNITY): Payer: Medicare Other

## 2010-10-22 ENCOUNTER — Emergency Department (HOSPITAL_COMMUNITY)
Admission: EM | Admit: 2010-10-22 | Discharge: 2010-10-22 | Disposition: A | Discharge status: Home / Self Care | Payer: Medicare Other | Attending: Emergency Medicine | Admitting: Emergency Medicine

## 2010-10-22 DIAGNOSIS — Z79899 Other long term (current) drug therapy: Secondary | ICD-10-CM | POA: Insufficient documentation

## 2010-10-22 DIAGNOSIS — F411 Generalized anxiety disorder: Secondary | ICD-10-CM | POA: Insufficient documentation

## 2010-10-22 DIAGNOSIS — H6690 Otitis media, unspecified, unspecified ear: Secondary | ICD-10-CM | POA: Insufficient documentation

## 2010-10-22 DIAGNOSIS — R12 Heartburn: Secondary | ICD-10-CM | POA: Insufficient documentation

## 2010-10-22 DIAGNOSIS — K219 Gastro-esophageal reflux disease without esophagitis: Secondary | ICD-10-CM | POA: Insufficient documentation

## 2010-10-22 DIAGNOSIS — R109 Unspecified abdominal pain: Secondary | ICD-10-CM | POA: Insufficient documentation

## 2010-10-22 DIAGNOSIS — M549 Dorsalgia, unspecified: Secondary | ICD-10-CM | POA: Insufficient documentation

## 2010-10-22 DIAGNOSIS — I10 Essential (primary) hypertension: Secondary | ICD-10-CM | POA: Insufficient documentation

## 2010-10-22 DIAGNOSIS — R112 Nausea with vomiting, unspecified: Secondary | ICD-10-CM | POA: Insufficient documentation

## 2010-10-22 LAB — LIPASE, BLOOD: Lipase: 33 U/L (ref 11–59)

## 2010-10-22 LAB — BASIC METABOLIC PANEL
Chloride: 102 mEq/L (ref 96–112)
GFR calc Af Amer: >60 mL/min (ref >60)
Potassium: 3.7 mEq/L (ref 3.5–5.1)
Sodium: 139 mEq/L (ref 135–145)

## 2010-10-22 LAB — POCT CARDIAC MARKERS
CKMB, poc: <1 ng/mL — ABNORMAL LOW (ref 1.0–8.0)
Myoglobin, poc: 60.8 ng/mL (ref 12–200)
Troponin i, poc: <0.05 ng/mL (ref 0.00–0.09)

## 2010-10-22 LAB — CBC
MCV: 75.3 fL — ABNORMAL LOW (ref 78.0–100.0)
Platelets: 382 10*3/uL (ref 150–400)
RBC: 4.46 MIL/uL (ref 3.87–5.11)
WBC: 10 10*3/uL (ref 4.0–10.5)

## 2010-10-22 LAB — DIFFERENTIAL
Basophils Absolute: 0.1 10*3/uL (ref 0.0–0.1)
Basophils Relative: 1 % (ref 0–1)
Eosinophils Absolute: 0.3 10*3/uL (ref 0.0–0.7)
Lymphs Abs: 2.9 10*3/uL (ref 0.7–4.0)
Neutrophils Relative %: 62 % (ref 43–77)

## 2010-12-15 NOTE — Consult Note (Signed)
Tara Conley, Tara Conley     ACCOUNT NO.:  [id number]  MEDICAL RECORD NO.:  [id number]          PATIENT TYPE:  AMB  LOCATION:  SDC                           FACILITY:  [facility]  PHYSICIAN:  Laurette Schimke, MD     DATE OF BIRTH:  Dec 16, 1950  DATE OF CONSULTATION:  07/22/2010 DATE OF DISCHARGE:                                CONSULTATION   REASON FOR CONSULTATION:  Consult was requested by Dr. Neva Seat for evaluation of pelvic mass.  HISTORY OF PRESENT ILLNESS:  This is a 60 year old para 3, last normal menstrual period in 1978, at which time she underwent hysterectomy.  She reports hot flashes since 1992.  The patient reports crampy lower abdominal pain for several years' duration once requiring hospitalization several years ago for significant pain associated with nausea.  The patient states that over the years, the pain has increased, but is still present.  She finally sought the advice of Dr. Neva Seat, who ordered a pelvic ultrasound.  Pelvic ultrasound was notable for right ovary measuring 4.8 cm in greatest dimension with a 3.4 x 2.8 cm cystic mass with debris.  The left adnexa measured 4.2 x 2.6 cm with multicystic.  No free fluid was appreciated.  There was no suspicious Doppler flow to either of the masses.  No hydronephrosis was identified bilaterally.  CA-125 was obtained, returned a value of 4.  CEA returned a value of 0.6.  The patient reports continued abdominal pain.  She reports abdominal bloating.  No weight loss or weight gain.  States her appetite is poor.  PAST MEDICAL HISTORY: 1. Lifelong obesity. 2. Low back pain since 1980 aggravated by a work associated fall 15     years ago. 3. Hypertension since 1995.  PAST SURGICAL HISTORY:  Bilateral knee replacements in October 2010 and March 2011, total vaginal hysterectomy in 1978, bilateral carpal tunnel surgery 14 years ago, removal of bilateral heel spurs in 1993 and 1994, tonsillectomy as a child.  SOCIAL  HISTORY:  Son committed suicide at the age of 55 in 39, sudden death of unexplained cause of her 2nd son at age of 76.  She has a daughter, who is alive and well.  She has been married for 12 years, although she and her partner had been together for 30 years.  She denies tobacco or alcohol use.  FAMILY HISTORY:  Notable for mother with laryngeal cancer, was a tobacco smoker.  Maternal aunt with lung cancer, was a tobacco smoker.  Maternal grandmother with Alzheimer disease.  MEDICATIONS: 1. Lisinopril 25 mg daily. 2. Hydrocodone. 3. OxyContin.  ALLERGIES:  PENICILLIN causes hives.  REVIEW OF SYSTEMS:  10-point review of systems negative except as noted above.  PHYSICAL EXAMINATION:  GENERAL:  Well-developed pleasant female in no acute distress. VITAL SIGNS:  Height 5 feet 2 inches weight 262 pounds. ABDOMEN:  Soft, nontender without any evidence of a hernia or surgical lesions. CHEST:  Clear to auscultation. HEART:  Regular rate and rhythm. BACK:  No CVA tenderness. ABDOMEN:  Soft, nontender.  No reproducible pain with examination. PELVIC:  Normal external genitalia, Bartholin, urethral, and Skene.  No nodularity noted within the cul-de-sac. RECTAL:  Good anal sphincter  tone without any masses.  IMPRESSION:  This patient is a lovely 60 year old with bilateral small complex adnexal masses that are symptomatic of longstanding duration and normal CA-125.  The symptomatology is concerning for possible bowel adhesions.  A proposed procedure would be that of a laparoscopic BSO. Given the concern for adhesions to the bowel, robotic approach would facilitate better dissection.  Otherwise, exploratory laparotomy would be indicated.  Proposed procedure will occur on February 28th.  The patient is aware of the possibility of exploratory laparotomy, staging and debulking as determined by the surgical findings.  All the patient's questions were answered to her  satisfaction.     Laurette Schimke, MD     WB/MEDQ  D:  07/22/2010  T:  07/23/2010  Job:  [id number]  cc:   Arlyce Harman, MD  Telford Nab, R.N. [address] [place], [place] [address]  Electronically Signed by Laurette Schimke MD on 07/23/2010 10:57:20 AM

## 2011-03-12 LAB — COMPREHENSIVE METABOLIC PANEL
ALT: 18 U/L (ref 0–35)
BUN: 10 mg/dL (ref 6–23)
Calcium: 9.6 mg/dL (ref 8.4–10.5)
Glucose, Bld: 123 mg/dL — ABNORMAL HIGH (ref 70–99)
Sodium: 134 mEq/L — ABNORMAL LOW (ref 135–145)
Total Protein: 8 g/dL (ref 6.0–8.3)

## 2011-03-12 LAB — DIFFERENTIAL
Lymphs Abs: 2.3 10*3/uL (ref 0.7–4.0)
Monocytes Relative: 4 % (ref 3–12)
Neutro Abs: 7.1 10*3/uL (ref 1.7–7.7)
Neutrophils Relative %: 71 % (ref 43–77)

## 2011-03-12 LAB — URINALYSIS, ROUTINE W REFLEX MICROSCOPIC
Glucose, UA: NEGATIVE mg/dL
Protein, ur: 100 mg/dL — AB
Specific Gravity, Urine: 1.025 (ref 1.005–1.030)
pH: 7 (ref 5.0–8.0)

## 2011-03-12 LAB — URINE MICROSCOPIC-ADD ON

## 2011-03-12 LAB — CBC
Hemoglobin: 15.9 g/dL — ABNORMAL HIGH (ref 12.0–15.0)
MCHC: 33.1 g/dL (ref 30.0–36.0)
Platelets: 365 10*3/uL (ref 150–400)
RDW: 12.6 % (ref 11.5–15.5)

## 2011-03-19 LAB — COMPREHENSIVE METABOLIC PANEL
Albumin: 4.6
BUN: 10
Calcium: 9.9
Creatinine, Ser: 0.81
Total Bilirubin: 0.6
Total Protein: 8

## 2011-03-19 LAB — CBC
HCT: 44.1
MCHC: 33.9
MCV: 92.3
Platelets: 302
RDW: 13

## 2011-03-19 LAB — LIPASE, BLOOD: Lipase: 24

## 2012-11-12 ENCOUNTER — Encounter (HOSPITAL_COMMUNITY): Payer: Self-pay | Admitting: Emergency Medicine

## 2012-11-12 ENCOUNTER — Emergency Department (HOSPITAL_COMMUNITY)
Admission: EM | Admit: 2012-11-12 | Discharge: 2012-11-13 | Disposition: A | Discharge status: Home / Self Care | Payer: 59 | Attending: Emergency Medicine | Admitting: Emergency Medicine

## 2012-11-12 DIAGNOSIS — T148XXA Other injury of unspecified body region, initial encounter: Secondary | ICD-10-CM

## 2012-11-12 DIAGNOSIS — S20229A Contusion of unspecified back wall of thorax, initial encounter: Secondary | ICD-10-CM | POA: Insufficient documentation

## 2012-11-12 DIAGNOSIS — Z859 Personal history of malignant neoplasm, unspecified: Secondary | ICD-10-CM | POA: Insufficient documentation

## 2012-11-12 DIAGNOSIS — J309 Allergic rhinitis, unspecified: Secondary | ICD-10-CM | POA: Insufficient documentation

## 2012-11-12 DIAGNOSIS — Z880 Allergy status to penicillin: Secondary | ICD-10-CM | POA: Insufficient documentation

## 2012-11-12 DIAGNOSIS — Z885 Allergy status to narcotic agent status: Secondary | ICD-10-CM | POA: Insufficient documentation

## 2012-11-12 DIAGNOSIS — Y929 Unspecified place or not applicable: Secondary | ICD-10-CM | POA: Insufficient documentation

## 2012-11-12 DIAGNOSIS — Z79899 Other long term (current) drug therapy: Secondary | ICD-10-CM | POA: Insufficient documentation

## 2012-11-12 DIAGNOSIS — R296 Repeated falls: Secondary | ICD-10-CM | POA: Insufficient documentation

## 2012-11-12 DIAGNOSIS — I10 Essential (primary) hypertension: Secondary | ICD-10-CM | POA: Insufficient documentation

## 2012-11-12 DIAGNOSIS — Y998 Other external cause status: Secondary | ICD-10-CM | POA: Insufficient documentation

## 2012-11-12 DIAGNOSIS — Z8701 Personal history of pneumonia (recurrent): Secondary | ICD-10-CM | POA: Insufficient documentation

## 2012-11-12 DIAGNOSIS — IMO0002 Reserved for concepts with insufficient information to code with codable children: Secondary | ICD-10-CM | POA: Insufficient documentation

## 2012-11-12 DIAGNOSIS — G8929 Other chronic pain: Secondary | ICD-10-CM | POA: Insufficient documentation

## 2012-11-12 HISTORY — DX: Other chronic pain: G89.29

## 2012-11-12 HISTORY — DX: Malignant (primary) neoplasm, unspecified: C80.1

## 2012-11-12 HISTORY — DX: Essential (primary) hypertension: I10

## 2012-11-12 HISTORY — DX: Other seasonal allergic rhinitis: J30.2

## 2012-11-12 HISTORY — DX: Dorsalgia, unspecified: M54.9

## 2012-11-12 HISTORY — DX: Reserved for concepts with insufficient information to code with codable children: IMO0002

## 2012-11-12 HISTORY — DX: Pneumonia, unspecified organism: J18.9

## 2012-11-12 NOTE — ED Notes (Signed)
Pt slipped and fell in bathtub yesterday when she went to get out.  C/o pain to lower back, neck pain, and L elbow.  Denies LOC.

## 2012-11-13 ENCOUNTER — Emergency Department (HOSPITAL_COMMUNITY): Payer: 59

## 2012-11-13 MED ORDER — MELOXICAM 7.5 MG PO TABS: 7.5000 mg | ORAL_TABLET | Freq: Every day | ORAL | Status: DC

## 2012-11-13 MED ORDER — TRAMADOL HCL 50 MG PO TABS
50.0000 mg | ORAL_TABLET | Freq: Once | ORAL | Status: AC
  Administered 2012-11-13: 50 mg via ORAL
  Filled 2012-11-13: qty 1

## 2012-11-13 NOTE — ED Notes (Signed)
Pt transported to XR/CT

## 2012-11-13 NOTE — ED Notes (Signed)
Patient wondering when she is going home.

## 2012-11-13 NOTE — ED Notes (Signed)
Called xray and they stated that she refused her elbow xray.  Dr. Nicanor Alcon notified

## 2012-11-13 NOTE — ED Provider Notes (Signed)
History     CSN: [id number]  Arrival date & time 11/12/12  2301   First MD Initiated Contact with Patient 11/12/12 2359      Chief Complaint  Patient presents with  . Fall  . Back Pain    (Consider location/radiation/quality/duration/timing/severity/associated sxs/prior treatment) Patient is a 62 y.o. female presenting with fall and back pain. The history is provided by the patient. No language interpreter was used.  Fall This is a new problem. The current episode started yesterday. The problem occurs constantly. The problem has not changed since onset.Pertinent negatives include no chest pain, no abdominal pain, no headaches and no shortness of breath. Nothing aggravates the symptoms. Nothing relieves the symptoms. She has tried nothing for the symptoms. The treatment provided no relief.  Back Pain Associated symptoms: no abdominal pain, no chest pain and no headaches     Past Medical History  Diagnosis Date  . Back pain, chronic   . Herniated disc   . Bulging disc   . Pneumonia   . Seasonal allergies   . Hypertension   . Cancer     Past Surgical History  Procedure Laterality Date  . Abdominal hysterectomy    . Knee surgery    . Hand surgery    . Tonsillectomy      No family history on file.  History  Substance Use Topics  . Smoking status: Never Smoker   . Smokeless tobacco: Not on file  . Alcohol Use: No    OB History   Grav Para Term Preterm Abortions TAB SAB Ect Mult Living                  Review of Systems  Respiratory: Negative for shortness of breath.   Cardiovascular: Negative for chest pain.  Gastrointestinal: Negative for abdominal pain.  Musculoskeletal: Positive for back pain.  Neurological: Negative for headaches.  All other systems reviewed and are negative.    Allergies  Haloperidol and related; Penicillins; and Nickel  Home Medications   Current Outpatient Rx  Name  Route  Sig  Dispense  Refill  . amLODipine (NORVASC) 10 MG  tablet   Oral   Take 10 mg by mouth daily.         . Bepotastine Besilate 1.5 % SOLN   Both Eyes   Place 2 drops into both eyes 2 (two) times daily.         . fluticasone (FLONASE) 50 MCG/ACT nasal spray   Nasal   Place 2 sprays into the nose 2 (two) times daily.         . hydrALAZINE (APRESOLINE) 25 MG tablet   Oral   Take 25 mg by mouth 4 (four) times daily.         Marland Kitchen HYDROcodone-acetaminophen (NORCO/VICODIN) 5-325 MG per tablet   Oral   Take 1 tablet by mouth every 6 (six) hours as needed for pain.         . methocarbamol (ROBAXIN) 750 MG tablet   Oral   Take 750 mg by mouth every 4 (four) hours as needed (for pain).         . montelukast (SINGULAIR) 10 MG tablet   Oral   Take 10 mg by mouth at bedtime.         . pantoprazole (PROTONIX) 40 MG tablet   Oral   Take 40 mg by mouth daily.         . promethazine (PHENERGAN) 25 MG tablet   Oral  Take 25 mg by mouth every 6 (six) hours as needed for nausea.         Marland Kitchen tiZANidine (ZANAFLEX) 4 MG tablet   Oral   Take 4 mg by mouth every 4 (four) hours as needed (for muscle pain).           BP 141/98  Pulse 107  Temp(Src) 97.3 F (36.3 C) (Oral)  Resp 20  SpO2 94%  Physical Exam  Constitutional: She is oriented to person, place, and time. She appears well-developed and well-nourished. No distress.  HENT:  Head: Normocephalic and atraumatic.  Right Ear: No hemotympanum.  Left Ear: No hemotympanum.  Mouth/Throat: Oropharynx is clear and moist.  Eyes: Conjunctivae and EOM are normal. Pupils are equal, round, and reactive to light.  Neck: Normal range of motion. Neck supple. No tracheal deviation present.  Cardiovascular: Normal rate, regular rhythm and intact distal pulses.   Pulmonary/Chest: Effort normal and breath sounds normal. She has no wheezes. She has no rales.  Abdominal: Soft. Bowel sounds are normal. There is no tenderness. There is no rebound and no guarding.  Musculoskeletal: Normal  range of motion. She exhibits no edema and no tenderness.  Neurological: She is alert and oriented to person, place, and time.  Skin: Skin is warm and dry.  Psychiatric: She has a normal mood and affect.    ED Course  Procedures (including critical care time)  Labs Reviewed - No data to display Dg Lumbar Spine Complete  11/13/2012   *RADIOLOGY REPORT*  Clinical Data: Low back pain after fall.  LUMBAR SPINE - COMPLETE 4+ VIEW  Comparison: CT abdomen and pelvis 11/06/2009.  Findings: Five lumbar type vertebrae.  Normal alignment of the lumbar vertebrae and facet joints.  No vertebral compression deformities.  Diffuse degenerative changes with mild intervertebral disc space narrowing and endplate hypertrophic changes throughout. Degenerative changes in the lumbar facet joints.  No focal bone lesion or bone destruction.  Bone cortex and trabecular architecture appear intact.  IMPRESSION: Degenerative changes in the lumbar spine.  No displaced acute fractures identified.   Original Report Authenticated By: Burman Nieves, M.D.     No diagnosis found.    MDM  See release of medical records from [facility], patient admitted for AMS secondary to respiratory failure and substance use.  Plan was to detox patient.  Suspect patient is here to obtain narcotics.  Will prescribe anti inflammatories.         Jasmine Awe, MD 11/13/12 [phone]

## 2013-04-04 ENCOUNTER — Encounter (HOSPITAL_COMMUNITY): Payer: Self-pay | Admitting: Emergency Medicine

## 2013-04-04 ENCOUNTER — Observation Stay (HOSPITAL_COMMUNITY)
Admission: EM | Admit: 2013-04-04 | Discharge: 2013-04-05 | Disposition: A | Discharge status: Home / Self Care | Payer: 59 | Attending: Internal Medicine | Admitting: Internal Medicine

## 2013-04-04 ENCOUNTER — Emergency Department (HOSPITAL_COMMUNITY): Payer: 59

## 2013-04-04 DIAGNOSIS — R21 Rash and other nonspecific skin eruption: Secondary | ICD-10-CM | POA: Insufficient documentation

## 2013-04-04 DIAGNOSIS — R7309 Other abnormal glucose: Secondary | ICD-10-CM | POA: Insufficient documentation

## 2013-04-04 DIAGNOSIS — I10 Essential (primary) hypertension: Secondary | ICD-10-CM | POA: Insufficient documentation

## 2013-04-04 DIAGNOSIS — R0902 Hypoxemia: Principal | ICD-10-CM | POA: Insufficient documentation

## 2013-04-04 DIAGNOSIS — J209 Acute bronchitis, unspecified: Secondary | ICD-10-CM | POA: Diagnosis present

## 2013-04-04 DIAGNOSIS — J40 Bronchitis, not specified as acute or chronic: Secondary | ICD-10-CM

## 2013-04-04 DIAGNOSIS — G8929 Other chronic pain: Secondary | ICD-10-CM | POA: Insufficient documentation

## 2013-04-04 DIAGNOSIS — R062 Wheezing: Secondary | ICD-10-CM | POA: Insufficient documentation

## 2013-04-04 DIAGNOSIS — E876 Hypokalemia: Secondary | ICD-10-CM | POA: Insufficient documentation

## 2013-04-04 DIAGNOSIS — R0602 Shortness of breath: Secondary | ICD-10-CM | POA: Insufficient documentation

## 2013-04-04 DIAGNOSIS — Z79899 Other long term (current) drug therapy: Secondary | ICD-10-CM | POA: Insufficient documentation

## 2013-04-04 DIAGNOSIS — M549 Dorsalgia, unspecified: Secondary | ICD-10-CM | POA: Insufficient documentation

## 2013-04-04 LAB — BASIC METABOLIC PANEL
GFR calc non Af Amer: 90 mL/min — ABNORMAL LOW (ref >90)
Glucose, Bld: 130 mg/dL — ABNORMAL HIGH (ref 70–99)
Potassium: 3.4 mEq/L — ABNORMAL LOW (ref 3.5–5.1)
Sodium: 134 mEq/L — ABNORMAL LOW (ref 135–145)

## 2013-04-04 LAB — CBC
Hemoglobin: 13.5 g/dL (ref 12.0–15.0)
MCH: 27.3 pg (ref 26.0–34.0)
RBC: 4.95 MIL/uL (ref 3.87–5.11)

## 2013-04-04 LAB — PRO B NATRIURETIC PEPTIDE: Pro B Natriuretic peptide (BNP): 218.4 pg/mL — ABNORMAL HIGH (ref 0–125)

## 2013-04-04 LAB — GLUCOSE, CAPILLARY: Glucose-Capillary: 139 mg/dL — ABNORMAL HIGH (ref 70–99)

## 2013-04-04 LAB — D-DIMER, QUANTITATIVE: D-Dimer, Quant: 0.6 ug/mL-FEU — ABNORMAL HIGH (ref 0.00–0.48)

## 2013-04-04 LAB — TROPONIN I: Troponin I: <0.3 ng/mL (ref <0.30)

## 2013-04-04 LAB — POCT I-STAT TROPONIN I: Troponin i, poc: 0 ng/mL (ref 0.00–0.08)

## 2013-04-04 MED ORDER — ALBUTEROL SULFATE (5 MG/ML) 0.5% IN NEBU: 5.0000 mg | INHALATION_SOLUTION | Freq: Once | RESPIRATORY_TRACT | Status: DC

## 2013-04-04 MED ORDER — ALBUTEROL (5 MG/ML) CONTINUOUS INHALATION SOLN
10.0000 mg/h | INHALATION_SOLUTION | Freq: Once | RESPIRATORY_TRACT | Status: AC
  Administered 2013-04-04: 10 mg/h via RESPIRATORY_TRACT
  Filled 2013-04-04: qty 20

## 2013-04-04 MED ORDER — TIZANIDINE HCL 4 MG PO TABS
4.0000 mg | ORAL_TABLET | Freq: Two times a day (BID) | ORAL | Status: DC
Start: 2013-04-04 — End: 2013-04-05
  Administered 2013-04-04 – 2013-04-05 (×2): 4 mg via ORAL
  Filled 2013-04-04 (×3): qty 1

## 2013-04-04 MED ORDER — PREDNISONE 20 MG PO TABS
40.0000 mg | ORAL_TABLET | Freq: Every day | ORAL | Status: DC
  Administered 2013-04-05: 08:00:00 40 mg via ORAL
  Filled 2013-04-04 (×2): qty 2

## 2013-04-04 MED ORDER — DIPHENHYDRAMINE HCL 25 MG PO CAPS: 25.0000 mg | ORAL_CAPSULE | Freq: Four times a day (QID) | ORAL | Status: DC | PRN

## 2013-04-04 MED ORDER — MONTELUKAST SODIUM 10 MG PO TABS
10.0000 mg | ORAL_TABLET | Freq: Every day | ORAL | Status: DC
  Administered 2013-04-04: 10 mg via ORAL
  Filled 2013-04-04 (×2): qty 1

## 2013-04-04 MED ORDER — PREDNISONE 50 MG PO TABS: 50.0000 mg | ORAL_TABLET | Freq: Every day | ORAL | Status: DC

## 2013-04-04 MED ORDER — IPRATROPIUM BROMIDE 0.02 % IN SOLN
0.5000 mg | Freq: Once | RESPIRATORY_TRACT | Status: AC
  Administered 2013-04-04: 0.5 mg via RESPIRATORY_TRACT
  Filled 2013-04-04: qty 2.5

## 2013-04-04 MED ORDER — OXYCODONE HCL ER 10 MG PO T12A
10.0000 mg | EXTENDED_RELEASE_TABLET | Freq: Two times a day (BID) | ORAL | Status: DC
  Administered 2013-04-04 – 2013-04-05 (×2): 10 mg via ORAL
  Filled 2013-04-04 (×2): qty 1

## 2013-04-04 MED ORDER — HYDROCODONE-ACETAMINOPHEN 5-325 MG PO TABS
1.0000 | ORAL_TABLET | Freq: Four times a day (QID) | ORAL | Status: DC | PRN
  Administered 2013-04-04 – 2013-04-05 (×3): 2 via ORAL
  Administered 2013-04-05: 1 via ORAL
  Filled 2013-04-04 (×3): qty 2
  Filled 2013-04-04: qty 1

## 2013-04-04 MED ORDER — LEVALBUTEROL HCL 0.63 MG/3ML IN NEBU
0.6300 mg | INHALATION_SOLUTION | Freq: Four times a day (QID) | RESPIRATORY_TRACT | Status: DC
  Administered 2013-04-04: 21:00:00 0.63 mg via RESPIRATORY_TRACT
  Filled 2013-04-04: qty 3

## 2013-04-04 MED ORDER — SODIUM CHLORIDE 0.9 % IV SOLN: 250.0000 mL | INTRAVENOUS | Status: DC | PRN

## 2013-04-04 MED ORDER — POTASSIUM CHLORIDE CRYS ER 20 MEQ PO TBCR
40.0000 meq | EXTENDED_RELEASE_TABLET | Freq: Once | ORAL | Status: AC
  Administered 2013-04-04: 23:00:00 40 meq via ORAL
  Filled 2013-04-04: qty 2

## 2013-04-04 MED ORDER — ALBUTEROL SULFATE HFA 108 (90 BASE) MCG/ACT IN AERS: 1.0000 | INHALATION_SPRAY | Freq: Four times a day (QID) | RESPIRATORY_TRACT | Status: DC | PRN

## 2013-04-04 MED ORDER — SODIUM CHLORIDE 0.9 % IJ SOLN
3.0000 mL | INTRAMUSCULAR | Status: DC | PRN
  Administered 2013-04-05: 08:00:00 3 mL via INTRAVENOUS

## 2013-04-04 MED ORDER — FLUTICASONE PROPIONATE 50 MCG/ACT NA SUSP
2.0000 | Freq: Two times a day (BID) | NASAL | Status: DC
  Administered 2013-04-04 – 2013-04-05 (×2): 2 via NASAL
  Filled 2013-04-04: qty 16

## 2013-04-04 MED ORDER — KETOROLAC TROMETHAMINE 30 MG/ML IJ SOLN: 30.0000 mg | Freq: Once | INTRAMUSCULAR | Status: DC

## 2013-04-04 MED ORDER — ENOXAPARIN SODIUM 40 MG/0.4ML ~~LOC~~ SOLN
40.0000 mg | SUBCUTANEOUS | Status: DC
  Administered 2013-04-04: 40 mg via SUBCUTANEOUS
  Filled 2013-04-04 (×2): qty 0.4

## 2013-04-04 MED ORDER — METHYLPREDNISOLONE SODIUM SUCC 125 MG IJ SOLR
125.0000 mg | Freq: Once | INTRAMUSCULAR | Status: AC
  Administered 2013-04-04: 125 mg via INTRAVENOUS
  Filled 2013-04-04: qty 2

## 2013-04-04 MED ORDER — MORPHINE SULFATE 2 MG/ML IJ SOLN
1.0000 mg | INTRAMUSCULAR | Status: DC | PRN
  Administered 2013-04-04 – 2013-04-05 (×3): 1 mg via INTRAVENOUS
  Filled 2013-04-04 (×3): qty 1

## 2013-04-04 MED ORDER — ACETAMINOPHEN 325 MG PO TABS: 650.0000 mg | ORAL_TABLET | Freq: Four times a day (QID) | ORAL | Status: DC | PRN

## 2013-04-04 MED ORDER — INFLUENZA VAC SPLIT QUAD 0.5 ML IM SUSP
0.5000 mL | INTRAMUSCULAR | Status: AC
  Administered 2013-04-05: 09:00:00 0.5 mL via INTRAMUSCULAR
  Filled 2013-04-04 (×2): qty 0.5

## 2013-04-04 MED ORDER — LEVALBUTEROL HCL 0.63 MG/3ML IN NEBU: 0.6300 mg | INHALATION_SOLUTION | RESPIRATORY_TRACT | Status: DC | PRN

## 2013-04-04 MED ORDER — ONDANSETRON HCL 4 MG/2ML IJ SOLN
4.0000 mg | Freq: Four times a day (QID) | INTRAMUSCULAR | Status: DC | PRN
  Administered 2013-04-04: 4 mg via INTRAVENOUS
  Filled 2013-04-04: qty 2

## 2013-04-04 MED ORDER — ONDANSETRON HCL 4 MG PO TABS: 4.0000 mg | ORAL_TABLET | Freq: Four times a day (QID) | ORAL | Status: DC | PRN

## 2013-04-04 MED ORDER — SODIUM CHLORIDE 0.9 % IJ SOLN: 3.0000 mL | Freq: Two times a day (BID) | INTRAMUSCULAR | Status: DC

## 2013-04-04 MED ORDER — FAMOTIDINE 20 MG PO TABS
20.0000 mg | ORAL_TABLET | Freq: Two times a day (BID) | ORAL | Status: DC
  Administered 2013-04-04 – 2013-04-05 (×2): 20 mg via ORAL
  Filled 2013-04-04 (×3): qty 1

## 2013-04-04 MED ORDER — DIAZEPAM 2 MG PO TABS
2.0000 mg | ORAL_TABLET | Freq: Two times a day (BID) | ORAL | Status: DC | PRN
  Administered 2013-04-04: 23:00:00 2 mg via ORAL
  Filled 2013-04-04: qty 1

## 2013-04-04 MED ORDER — SODIUM CHLORIDE 0.9 % IJ SOLN
3.0000 mL | Freq: Two times a day (BID) | INTRAMUSCULAR | Status: DC
  Administered 2013-04-04 – 2013-04-05 (×2): 3 mL via INTRAVENOUS

## 2013-04-04 MED ORDER — HYDRALAZINE HCL 25 MG PO TABS
25.0000 mg | ORAL_TABLET | Freq: Four times a day (QID) | ORAL | Status: DC
  Administered 2013-04-04 – 2013-04-05 (×3): 25 mg via ORAL
  Filled 2013-04-04 (×5): qty 1

## 2013-04-04 MED ORDER — ACETAMINOPHEN 650 MG RE SUPP: 650.0000 mg | Freq: Four times a day (QID) | RECTAL | Status: DC | PRN

## 2013-04-04 MED ORDER — ALBUTEROL SULFATE (5 MG/ML) 0.5% IN NEBU
5.0000 mg | INHALATION_SOLUTION | RESPIRATORY_TRACT | Status: DC | PRN
  Administered 2013-04-04: 5 mg via RESPIRATORY_TRACT
  Filled 2013-04-04: qty 1

## 2013-04-04 NOTE — ED Provider Notes (Signed)
Care assumed at sign out from Dr. Lynelle Doctor. Patient became hypoxic to 73% while ambulating. Still wheezing despite getting solumedrol and 2 nebs. Will give continuous nebs and admit for bronchitis. Will admit to tele under Dr. Donna Bernard.   Richardean Canal, MD 04/04/13 [phone]

## 2013-04-04 NOTE — H&P (Addendum)
[facility] History and Physical  Tara Conley UEA:[id number] DOB: 03/30/1951 DOA: 04/04/2013  Referring physician: Dr. Silverio Lay PCP: No PCP Per Patient  Specialists: none  Chief Complaint: SOB and facial rash  HPI: Tara Conley is a 62 y.o. female with history of hypertension, chronic back pain seasonal allergies who presents with above complaints. She states that early this morning she noted a red spot on her upper lip, and as the day went by he she developed of the red spots/rash and began having difficulty breathing as well.she admits to soreness in her chest but mostly if she presses down on it. She denies using any new soaps/detergents, lotions or being out in the grass, and no new foods. She denies cough and fevers, pruritus, and no leg swelling. She came to the ED because of the shortness of breath and chest x-ray showed no acute cardiopulmonary abnormality, BNP 218, troponin 0. Her ambulatory O2 sats was checked on room air and was 73% and patient was noted to be wheezing. She denies any history of asthma and is a nonsmoker. She is admitted for further evaluation and management.   Review of Systems: The patient denies anorexia, fever, weight loss, vision loss, decreased hearing, hoarseness, syncope, peripheral edema, balance deficits, hemoptysis, abdominal pain, melena, hematochezia, severe indigestion/heartburn, , muscle weakness, suspicious skin lesions, transient blindness, difficulty walking, depression, unusual weight change, abnormal .  Past Medical History  Diagnosis Date  . Back pain, chronic   . Herniated disc   . Bulging disc   . Pneumonia   . Seasonal allergies   . Hypertension   . Cancer    Past Surgical History  Procedure Laterality Date  . Abdominal hysterectomy    . Knee surgery    . Hand surgery    . Tonsillectomy     Social History:  reports that she has never smoked. She has never used smokeless tobacco. She reports that she does not drink alcohol  or use illicit drugs.  where does patient live--home, Can patient participate in ADLs-yes  Allergies  Allergen Reactions  . Haloperidol And Related Swelling    Tongue swelling  . Penicillins Hives  . Nickel Rash    Family history- mother had throat cancer  Prior to Admission medications   Medication Sig Start Date End Date Taking? Authorizing Provider  Bepotastine Besilate 1.5 % SOLN Place 2 drops into both eyes 2 (two) times daily.   Yes Historical Provider, MD  DIAZEPAM PO Take 1 tablet by mouth 2 (two) times daily.   Yes Historical Provider, MD  fluticasone (FLONASE) 50 MCG/ACT nasal spray Place 2 sprays into the nose 2 (two) times daily.   Yes Historical Provider, MD  hydrALAZINE (APRESOLINE) 25 MG tablet Take 25 mg by mouth 4 (four) times daily.   Yes Historical Provider, MD  HYDROcodone-acetaminophen (NORCO/VICODIN) 5-325 MG per tablet Take 1 tablet by mouth every 6 (six) hours as needed for pain.   Yes Historical Provider, MD  montelukast (SINGULAIR) 10 MG tablet Take 10 mg by mouth at bedtime.   Yes Historical Provider, MD  OXYCODONE HCL PO Take 1 tablet by mouth 2 (two) times daily.   Yes Historical Provider, MD  promethazine (PHENERGAN) 25 MG tablet Take 25 mg by mouth every 6 (six) hours as needed for nausea.   Yes Historical Provider, MD  tiZANidine (ZANAFLEX) 4 MG tablet Take 4 mg by mouth 2 (two) times daily.    Yes Historical Provider, MD  albuterol (PROVENTIL HFA;VENTOLIN HFA) 108 (90  BASE) MCG/ACT inhaler Inhale 1-2 puffs into the lungs every 6 (six) hours as needed for wheezing. 04/04/13   Celene Kras, MD  predniSONE (DELTASONE) 50 MG tablet Take 1 tablet (50 mg total) by mouth daily. 04/04/13   Celene Kras, MD   Physical Exam: Filed Vitals:   04/04/13 1854  BP: 143/86  Pulse: 94  Temp: 98.6 F (37 C)  Resp: 16    Constitutional: Vital signs reviewed.  Patient is a well-developed and obese, in no acute distress and cooperative with exam. Alert and oriented x3.   Head: Normocephalic and atraumatic Mouth: no erythema or exudates, MMM Eyes: PERRL, EOMI, conjunctivae normal, No scleral icterus.  Neck: Supple, Trachea midline normal ROM, No JVD, mass, thyromegaly, or carotid bruit present.  Cardiovascular: RRR, S1 normal, S2 normal, no MRG, pulses symmetric and intact bilaterally. Mid sternal chest wall tenderness Pulmonary/Chest: normal respiratory effort, CTAB, distant rare wheeze, no crackles, or rhonchi Abdominal: Obese, Soft. Non-tender, non-distended, bowel sounds are normal, no masses, organomegaly, or guarding present.  GU: no CVA tenderness  extremities : No cyanosis and no edema  Neurological: A&O x3, Strength is normal and symmetric bilaterally, cranial nerve II-XII are grossly intact, no focal motor deficit, sensory is intact the  Skin: She has scattered erythematous macules on her face.  Psychiatric: Normal mood and affect. speech and behavior is normal.    Labs on Admission:  Basic Metabolic Panel:  Recent Labs Lab 04/04/13 1440  NA 134*  K 3.4*  CL 95*  CO2 31  GLUCOSE 130*  BUN 16  CREATININE 0.75  CALCIUM 9.7   Liver Function Tests: No results found for this basename: AST, ALT, ALKPHOS, BILITOT, PROT, ALBUMIN,  in the last 168 hours No results found for this basename: LIPASE, AMYLASE,  in the last 168 hours No results found for this basename: AMMONIA,  in the last 168 hours CBC:  Recent Labs Lab 04/04/13 1440  WBC 7.9  HGB 13.5  HCT 44.3  MCV 89.5  PLT 259   Cardiac Enzymes: No results found for this basename: CKTOTAL, CKMB, CKMBINDEX, TROPONINI,  in the last 168 hours  BNP (last 3 results)  Recent Labs  04/04/13 1432  PROBNP 218.4*   CBG:  Recent Labs Lab 04/04/13 1456  GLUCAP 139*    Radiological Exams on Admission: Dg Chest 2 View  04/04/2013   CLINICAL DATA:  Shortness of breath, cough.  EXAM: CHEST  2 VIEW  COMPARISON:  November 06, 2009.  FINDINGS: Stable cardiomediastinal silhouette. No pleural  effusion or pneumothorax is noted. Stable scarring is noted in left midlung. No acute pulmonary disease is noted.  IMPRESSION: No acute cardiopulmonary abnormality seen.   Electronically Signed   By: Roque Lias M.D.   On: 04/04/2013 15:34     Assessment/Plan Present on Admission:  . 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  . Hypoxia -  #1/allergic reaction possibly contributing, but also per her report that she takes Valium 4 times daily(although her med rec states 2 times daily) and this along with the oxycodone and hydrocodone she also takes could possibly be contributing to respiratory suppression, in this pt - Will also obtain a d-dimer and if elevated will need CT angio to eval for PE, her BNP is unremarkable -follow and Obtain O2 sats on room air in a.m.  Marland Kitchen  Rash and nonspecific skin eruption/probable allergic reaction  . HTN (hypertension), benign - Continue hydralazine.  . Chronic back pain - Continue pain management, will decrease Valium frequency prn . Hypokalemia - Replace potassium . elevated blood glucose - Likely secondary to steroids, obtain A1c follow.      Code Status: full Family Communication: none at bedside  Disposition Plan: admit to tele for obsv  Time spent: >40mins  Kela Millin [facility] Pager [phone]  If 7PM-7AM, please contact night-coverage [email or web address] Password [facility] 04/04/2013, 7:58 PM

## 2013-04-04 NOTE — Progress Notes (Signed)
Patient does not want to sign up for My Chart at current time. Briscoe Burns BSN, RN-BC Admissions RN  04/04/2013 6:12 PM

## 2013-04-04 NOTE — Progress Notes (Signed)
Utilization Review completed.  Adrena Nakamura RN CM  

## 2013-04-04 NOTE — ED Provider Notes (Signed)
CSN: [id number]     Arrival date & time 04/04/13  1354 History   First MD Initiated Contact with Patient 04/04/13 1426     Chief Complaint  Patient presents with  . Wheezing  . Allergic Reaction    HPI Pt noticed a rash on her face around 6am.  This afternoon she started having trouble with her breathing.  She was wheezing and felt short of breath.  She felt like her nose was congested and she felt diaphoretic.  No chest pain.   No fevers.  No vomiting or diarrhea.  She has not had trouble like this before.  She denies asthma or hives.  She not noticed any leg swelling.  The rash on her face is a few sores.  The one on her upper lip became swollen this am.  She called 911 and was given a breathing treatment which has helped.  Pt denies any new medications or foods. Past Medical History  Diagnosis Date  . Back pain, chronic   . Herniated disc   . Bulging disc   . Pneumonia   . Seasonal allergies   . Hypertension   . Cancer    Past Surgical History  Procedure Laterality Date  . Abdominal hysterectomy    . Knee surgery    . Hand surgery    . Tonsillectomy     No family history on file. History  Substance Use Topics  . Smoking status: Never Smoker   . Smokeless tobacco: Not on file  . Alcohol Use: No   OB History   Grav Para Term Preterm Abortions TAB SAB Ect Mult Living                 Review of Systems  Constitutional: Positive for diaphoresis. Negative for fever.  HENT: Positive for rhinorrhea. Negative for sore throat, trouble swallowing and voice change.   Respiratory: Positive for cough and shortness of breath.   All other systems reviewed and are negative.    Allergies  Haloperidol and related; Penicillins; and Nickel  Home Medications   Current Outpatient Rx  Name  Route  Sig  Dispense  Refill  . Bepotastine Besilate 1.5 % SOLN   Both Eyes   Place 2 drops into both eyes 2 (two) times daily.         Marland Kitchen DIAZEPAM PO   Oral   Take 1 tablet by mouth 2  (two) times daily.         . fluticasone (FLONASE) 50 MCG/ACT nasal spray   Nasal   Place 2 sprays into the nose 2 (two) times daily.         . hydrALAZINE (APRESOLINE) 25 MG tablet   Oral   Take 25 mg by mouth 4 (four) times daily.         Marland Kitchen HYDROcodone-acetaminophen (NORCO/VICODIN) 5-325 MG per tablet   Oral   Take 1 tablet by mouth every 6 (six) hours as needed for pain.         . montelukast (SINGULAIR) 10 MG tablet   Oral   Take 10 mg by mouth at bedtime.         . OXYCODONE HCL PO   Oral   Take 1 tablet by mouth 2 (two) times daily.         . promethazine (PHENERGAN) 25 MG tablet   Oral   Take 25 mg by mouth every 6 (six) hours as needed for nausea.         Marland Kitchen  tiZANidine (ZANAFLEX) 4 MG tablet   Oral   Take 4 mg by mouth 2 (two) times daily.          Marland Kitchen albuterol (PROVENTIL HFA;VENTOLIN HFA) 108 (90 BASE) MCG/ACT inhaler   Inhalation   Inhale 1-2 puffs into the lungs every 6 (six) hours as needed for wheezing.   1 Inhaler   0   . predniSONE (DELTASONE) 50 MG tablet   Oral   Take 1 tablet (50 mg total) by mouth daily.   5 tablet   0     Dispense as written.    BP 183/74  Pulse 92  Temp(Src) 99 F (37.2 C) (Oral)  Resp 24  SpO2 98% Physical Exam  Nursing note and vitals reviewed. Constitutional: She appears well-developed and well-nourished. No distress.  HENT:  Head: Normocephalic and atraumatic.  Right Ear: External ear normal.  Left Ear: External ear normal.  Eyes: Conjunctivae are normal. Right eye exhibits no discharge. Left eye exhibits no discharge. No scleral icterus.  Neck: Neck supple. No tracheal deviation present.  Cardiovascular: Normal rate, regular rhythm and intact distal pulses.   Pulmonary/Chest: No stridor. No respiratory distress. She has wheezes. She has no rales.  Abdominal: Soft. Bowel sounds are normal. She exhibits no distension. There is no tenderness. There is no rebound and no guarding.  Musculoskeletal: She  exhibits no edema and no tenderness.  Neurological: She is alert. She has normal strength. No sensory deficit. Cranial nerve deficit:  no gross defecits noted. She exhibits normal muscle tone. She displays no seizure activity. Coordination normal.  Skin: Skin is warm. No rash noted. She is diaphoretic.  Small erythematous lesion on upper lip, ?cold sore  Psychiatric: She has a normal mood and affect.    ED Course  Procedures (including critical care time) Labs Review Labs Reviewed  BASIC METABOLIC PANEL - Abnormal; Notable for the following:    Sodium 134 (*)    Potassium 3.4 (*)    Chloride 95 (*)    Glucose, Bld 130 (*)    GFR calc non Af Amer 90 (*)    All other components within normal limits  CBC - Abnormal; Notable for the following:    RDW 15.6 (*)    All other components within normal limits  PRO B NATRIURETIC PEPTIDE - Abnormal; Notable for the following:    Pro B Natriuretic peptide (BNP) 218.4 (*)    All other components within normal limits  GLUCOSE, CAPILLARY - Abnormal; Notable for the following:    Glucose-Capillary 139 (*)    All other components within normal limits  POCT I-STAT TROPONIN I   Imaging Review Dg Chest 2 View  04/04/2013   CLINICAL DATA:  Shortness of breath, cough.  EXAM: CHEST  2 VIEW  COMPARISON:  November 06, 2009.  FINDINGS: Stable cardiomediastinal silhouette. No pleural effusion or pneumothorax is noted. Stable scarring is noted in left midlung. No acute pulmonary disease is noted.  IMPRESSION: No acute cardiopulmonary abnormality seen.   Electronically Signed   By: Roque Lias M.D.   On: 04/04/2013 15:34    EKG Interpretation     Ventricular Rate:  85 PR Interval:  174 QRS Duration: 91 QT Interval:  394 QTC Calculation: 468 R Axis:   -47 Text Interpretation:  Sinus rhythm Ventricular premature complex LAD, consider left anterior fascicular block Abnormal R-wave progression, late transition , slightly increased since last            1640 Wheezing has  resolved after treatments.  Pt is feeling better.  Will dc supplemental o2, have her ambulate and reasses MDM   1. Bronchitis with bronchospasm     Symptoms likely related to bronchospasm.  Possibly triggered by URI, bronchitis.  Doubt chf, pe, pna, ptx. Dr Silverio Lay will recheck patient.  If she continues to feel well and tolerated room air oxygen will dc home with inhaler and steroids.    Celene Kras, MD 04/04/13 [phone]

## 2013-04-04 NOTE — ED Notes (Signed)
Patient transported to X-ray 

## 2013-04-04 NOTE — ED Notes (Signed)
Pt presents in NAD- Per EMS- Pt reports rash to face at 6am with sudden on set on wheezing at 1330. EMS on scene. 90% RA Breathing TX albuterol 5mg  increased to 98%. RR 20-22. Wheezing not present yet returned immediately.EMS continued with additional TX of albuterol and atrovent 5/.5 in progress upon arrival to ED. Pt alert and active with care

## 2013-04-05 ENCOUNTER — Observation Stay (HOSPITAL_COMMUNITY): Payer: 59

## 2013-04-05 LAB — HEMOGLOBIN A1C
Hgb A1c MFr Bld: 6.6 % — ABNORMAL HIGH (ref <5.7)
Mean Plasma Glucose: 143 mg/dL — ABNORMAL HIGH (ref <117)

## 2013-04-05 LAB — BASIC METABOLIC PANEL
BUN: 12 mg/dL (ref 6–23)
CO2: 28 mEq/L (ref 19–32)
Chloride: 97 mEq/L (ref 96–112)
GFR calc Af Amer: >90 mL/min (ref >90)
Potassium: 4.5 mEq/L (ref 3.5–5.1)
Sodium: 136 mEq/L (ref 135–145)

## 2013-04-05 LAB — TROPONIN I: Troponin I: <0.3 ng/mL (ref <0.30)

## 2013-04-05 MED ORDER — ACYCLOVIR 5 % EX OINT
TOPICAL_OINTMENT | CUTANEOUS | Status: DC
Start: 2013-04-05 — End: 2013-04-05
  Administered 2013-04-05 (×2): via TOPICAL
  Filled 2013-04-05: qty 30

## 2013-04-05 MED ORDER — HYDROCODONE-ACETAMINOPHEN 5-325 MG PO TABS: 1.0000 | ORAL_TABLET | Freq: Four times a day (QID) | ORAL | Status: DC | PRN

## 2013-04-05 MED ORDER — DOXYCYCLINE HYCLATE 100 MG PO TABS
100.0000 mg | ORAL_TABLET | Freq: Two times a day (BID) | ORAL | Status: DC
  Administered 2013-04-05: 100 mg via ORAL
  Filled 2013-04-05 (×2): qty 1

## 2013-04-05 MED ORDER — LEVALBUTEROL HCL 0.63 MG/3ML IN NEBU
0.6300 mg | INHALATION_SOLUTION | Freq: Four times a day (QID) | RESPIRATORY_TRACT | Status: DC
  Administered 2013-04-05: 10:00:00 0.63 mg via RESPIRATORY_TRACT
  Filled 2013-04-05 (×4): qty 3

## 2013-04-05 MED ORDER — DIAZEPAM 5 MG PO TABS: 5.0000 mg | ORAL_TABLET | Freq: Two times a day (BID) | ORAL | Status: DC | PRN

## 2013-04-05 MED ORDER — HYDROMORPHONE HCL PF 1 MG/ML IJ SOLN
1.0000 mg | INTRAMUSCULAR | Status: DC | PRN
  Administered 2013-04-05 (×2): 1 mg via INTRAVENOUS
  Filled 2013-04-05: qty 2
  Filled 2013-04-05: qty 1

## 2013-04-05 MED ORDER — LEVALBUTEROL HCL 0.63 MG/3ML IN NEBU: 0.6300 mg | INHALATION_SOLUTION | RESPIRATORY_TRACT | Status: DC | PRN

## 2013-04-05 MED ORDER — IOHEXOL 350 MG/ML SOLN
100.0000 mL | Freq: Once | INTRAVENOUS | Status: AC | PRN
  Administered 2013-04-05: 100 mL via INTRAVENOUS

## 2013-04-05 MED ORDER — DOXYCYCLINE HYCLATE 100 MG PO TABS: 100.0000 mg | ORAL_TABLET | Freq: Two times a day (BID) | ORAL | Status: DC

## 2013-04-05 MED ORDER — HYDROMORPHONE HCL 2 MG PO TABS: 2.0000 mg | ORAL_TABLET | ORAL | Status: DC | PRN

## 2013-04-05 MED ORDER — ACYCLOVIR 5 % EX OINT: TOPICAL_OINTMENT | CUTANEOUS | Status: DC

## 2013-04-05 MED ORDER — HYDRALAZINE HCL 50 MG PO TABS: 50.0000 mg | ORAL_TABLET | Freq: Three times a day (TID) | ORAL | Status: DC

## 2013-04-05 MED ORDER — ALBUTEROL SULFATE HFA 108 (90 BASE) MCG/ACT IN AERS: 1.0000 | INHALATION_SPRAY | Freq: Four times a day (QID) | RESPIRATORY_TRACT | Status: AC | PRN

## 2013-04-05 MED ORDER — OXYCODONE HCL ER 10 MG PO T12A: 10.0000 mg | EXTENDED_RELEASE_TABLET | Freq: Two times a day (BID) | ORAL | Status: DC

## 2013-04-05 MED ORDER — HYDRALAZINE HCL 25 MG PO TABS: 50.0000 mg | ORAL_TABLET | Freq: Three times a day (TID) | ORAL | Status: DC

## 2013-04-05 NOTE — Progress Notes (Signed)
On room air at rest O2 sats were 88%, while ambulating sats dropped to 83%. Julio Sicks RN

## 2013-04-05 NOTE — Discharge Summary (Signed)
Physician Discharge Summary  Tara Conley FIE:[id number] DOB: 1951-05-12 DOA: 04/04/2013  PCP: No PCP Per Patient  Admit date: 04/04/2013 Discharge date: 04/05/2013  Recommendations for Outpatient Follow-up:  1. Please note that we have scheduled an appointment with Pulmonary [phone]) for 05/15/2013 at 10:30 am with [name] 2. You may continue albuterol inhaler as needed for shortness of breath and Singulair 10 mg daily 3. As we discussed we increased your hydralazine to 50 mg but frequency is 3 x day 4. You may use acyclovir ointment to the spots identified on your face, potential herpes infection. You may also continue doxycycline 100 mg twice daily for possible cellulitis. Referral to dermatology is provided.  5. CT angio chest was negative for pulmonary embolism.    Discharge Diagnoses:  Principal Problem:   Chronic back pain Active Problems:   Hypoxia   Rash and nonspecific skin eruption   HTN (hypertension), benign    Discharge Condition: medically stable for discharge home today  Diet recommendation: low sodium, heart healthy diet  History of present illness:  62 y.o. female with history of hypertension, chronic back pain seasonal allergies who presents with above complaints. She states that early this morning she noted a red spot on her upper lip, and as the day went by he she developed of the red spots/rash and began having difficulty breathing as well.she admits to soreness in her chest but mostly if she presses down on it. She denies using any new soaps/detergents, lotions or being out in the grass, and no new foods. She denies cough and fevers, pruritus, and no leg swelling. She came to the ED because of the shortness of breath and chest x-ray showed no acute cardiopulmonary abnormality, BNP 218, troponin 0. Her ambulatory O2 sats was checked on room air and was 73% and patient was noted to be wheezing. She denies any history of asthma and is a nonsmoker.  She is admitted for further evaluation and management.   Hospital Course:   Principal Problem:   Chronic back pain - no acute complaints - continue same home medication regimen; added dilaudid for temporary pain relief as she may have HSV/cold sores on face; this will also help with better pain control in the back area. Active Problems:   Hypoxia - likely OHS/OSA; pulmonary appt scheduled - ruled out PE with negative CT angio   Rash and nonspecific skin eruption - acyclovir ointment and doxycycline as prescribed - dermatology referral    HTN (hypertension), benign - continue same home meds   Signed:  Manson Passey, MD  [facility] 04/05/2013, 3:49 PM  Pager #: [phone]  Procedures:  None   Consultations:  None   Discharge Exam: Filed Vitals:   04/05/13 1416  BP: 174/110  Pulse:   Temp:   Resp: 20   Filed Vitals:   04/05/13 0919 04/05/13 0936 04/05/13 1416 04/05/13 1500  BP: 153/66  174/110   Pulse: 86     Temp: 98.6 F (37 C)     TempSrc: Oral  Oral   Resp:   20   Height:      Weight:      SpO2:  92%  85%    General: Pt is alert, follows commands appropriately, not in acute distress, obese SKIN: facial area random spots of petechia versus crusted lesion from HSV Cardiovascular: Regular rate and rhythm, S1/S2 +, no murmurs, no rubs, no gallops Respiratory: Clear to auscultation bilaterally, no wheezing, no crackles, no rhonchi Abdominal: Soft, non  tender, non distended, bowel sounds +, no guarding Extremities: no edema, no cyanosis, pulses palpable bilaterally DP and PT Neuro: Grossly nonfocal  Discharge Instructions  Discharge Orders   Future Appointments Provider Department Dept Phone   05/15/2013 10:30 AM [name], MD [facility] [phone]   Future Orders Complete By Expires   Call MD for:  difficulty breathing, headache or visual disturbances  As directed    Call MD for:  difficulty breathing, headache or visual  disturbances  As directed    Call MD for:  persistant dizziness or light-headedness  As directed    Call MD for:  persistant dizziness or light-headedness  As directed    Call MD for:  persistant nausea and vomiting  As directed    Call MD for:  persistant nausea and vomiting  As directed    Call MD for:  severe uncontrolled pain  As directed    Call MD for:  severe uncontrolled pain  As directed    Diet - low sodium heart healthy  As directed    Diet - low sodium heart healthy  As directed    Discharge instructions  As directed    Comments:     1. Please note that we have scheduled an appointment with Pulmonary [phone]) for 05/15/2013 at 10:30 am with [name] 2. You may continue albuterol inhaler as needed for shortness of breath and Singulair 10 mg daily 3. As we discussed we increased your hydralazine to 50 mg but frequency is 3 x day 4. You may use acyclovir ointment to the spots identified on your face, potential herpes infection. You may also continue doxycycline 100 mg twice daily for possible cellulitis. Referral to dermatology is provided.   Discharge instructions  As directed    Comments:     CT angio chest was negative for pulmonary embolism.   Increase activity slowly  As directed        Medication List    STOP taking these medications       OXYCODONE HCL PO  Replaced by:  OxyCODONE 10 mg T12a 12 hr tablet      TAKE these medications       acyclovir ointment 5 %  Commonly known as:  ZOVIRAX  Apply topically every 3 (three) hours. Apply to affected area on face. Use for 10 days.     albuterol 108 (90 BASE) MCG/ACT inhaler  Commonly known as:  PROVENTIL HFA;VENTOLIN HFA  Inhale 1-2 puffs into the lungs every 6 (six) hours as needed for wheezing.     Bepotastine Besilate 1.5 % Soln  Place 2 drops into both eyes 2 (two) times daily.     diazepam 5 MG tablet  Commonly known as:  VALIUM  Take 1 tablet (5 mg total) by mouth every 12 (twelve) hours as  needed.     doxycycline 100 MG tablet  Commonly known as:  VIBRA-TABS  Take 1 tablet (100 mg total) by mouth every 12 (twelve) hours.     fluticasone 50 MCG/ACT nasal spray  Commonly known as:  FLONASE  Place 2 sprays into the nose 2 (two) times daily.     hydrALAZINE 50 MG tablet  Commonly known as:  APRESOLINE  Take 1 tablet (50 mg total) by mouth 3 (three) times daily.     HYDROcodone-acetaminophen 5-325 MG per tablet  Commonly known as:  NORCO/VICODIN  Take 1 tablet by mouth every 6 (six) hours as needed for pain.  HYDROmorphone 2 MG tablet  Commonly known as:  DILAUDID  Take 1 tablet (2 mg total) by mouth every 4 (four) hours as needed for pain.     montelukast 10 MG tablet  Commonly known as:  SINGULAIR  Take 10 mg by mouth at bedtime.     OxyCODONE 10 mg T12a 12 hr tablet  Commonly known as:  OXYCONTIN  Take 1 tablet (10 mg total) by mouth every 12 (twelve) hours.     promethazine 25 MG tablet  Commonly known as:  PHENERGAN  Take 25 mg by mouth every 6 (six) hours as needed for nausea.     tiZANidine 4 MG tablet  Commonly known as:  ZANAFLEX  Take 4 mg by mouth 2 (two) times daily.           Follow-up Information   Follow up with [name], MD On 05/15/2013. (at 10:30 am)    Specialty:  Pulmonary Disease   Contact information:   [address] [place] [address] [phone]        The results of significant diagnostics from this hospitalization (including imaging, microbiology, ancillary and laboratory) are listed below for reference.    Significant Diagnostic Studies: Dg Chest 2 View  04/04/2013   CLINICAL DATA:  Shortness of breath, cough.  EXAM: CHEST  2 VIEW  COMPARISON:  November 06, 2009.  FINDINGS: Stable cardiomediastinal silhouette. No pleural effusion or pneumothorax is noted. Stable scarring is noted in left midlung. No acute pulmonary disease is noted.  IMPRESSION: No acute cardiopulmonary abnormality seen.   Electronically Signed    By: Roque Lias M.D.   On: 04/04/2013 15:34   Ct Angio Chest Pe W/cm &/or Wo Cm  04/05/2013   CLINICAL DATA:  Shortness of Breath  EXAM: CT ANGIOGRAPHY CHEST WITH CONTRAST  TECHNIQUE: Multidetector CT imaging of the chest was performed using the standard protocol during bolus administration of intravenous contrast. Multiplanar CT image reconstructions including MIPs were obtained to evaluate the vascular anatomy.  CONTRAST:  OMNIPAQUE IOHEXOL 350 MG/ML SOLN  COMPARISON:  Chest radiograph April 04, 2013  FINDINGS: The there is no demonstrable pulmonary embolus. There is no thoracic aortic aneurysm or dissection.  There is focal consolidation in the lateral segment of the left lower lobe. There is a smaller area of consolidation in the lateral segment right middle lobe inferiorly. There is bibasilar atelectatic change. Lungs are otherwise clear.  Heart is mildly enlarged. There is no appreciable pericardial effusion. There is no appreciable adenopathy. Small mediastinal lymph nodes are noted which do not meet size criteria for pathologic significance.  There is mild thickening of the wall of the distal esophagus.  Visualized upper abdominal structures appear normal. There are no blastic or lytic bone lesions. There is thoracic dextroscoliosis.  Review of the MIP images confirms the above findings.  IMPRESSION: There is patchy infiltrate in the bases, most notably in the lateral left base. No demonstrable pulmonary embolus. The heart is enlarged.   Electronically Signed   By: Bretta Bang M.D.   On: 04/05/2013 15:21    Microbiology: No results found for this or any previous visit (from the past 240 hour(s)).   Labs: Basic Metabolic Panel:  Recent Labs Lab 04/04/13 1440 04/05/13 0226  NA 134* 136  K 3.4* 4.5  CL 95* 97  CO2 31 28  GLUCOSE 130* 176*  BUN 16 12  CREATININE 0.75 0.61  CALCIUM 9.7 9.8   Liver Function Tests: No results  found for this basename: AST, ALT, ALKPHOS,  BILITOT, PROT, ALBUMIN,  in the last 168 hours No results found for this basename: LIPASE, AMYLASE,  in the last 168 hours No results found for this basename: AMMONIA,  in the last 168 hours CBC:  Recent Labs Lab 04/04/13 1440  WBC 7.9  HGB 13.5  HCT 44.3  MCV 89.5  PLT 259   Cardiac Enzymes:  Recent Labs Lab 04/04/13 2029 04/05/13 0226  TROPONINI <0.30 <0.30   BNP: BNP (last 3 results)  Recent Labs  04/04/13 1432  PROBNP 218.4*   CBG:  Recent Labs Lab 04/04/13 1456  GLUCAP 139*    Time coordinating discharge: Over 30 minutes

## 2013-04-05 NOTE — Care Management Note (Addendum)
    Page 1 of 1   04/05/2013     4:23:56 PM   CARE MANAGEMENT NOTE 04/05/2013  Patient:  Tara Conley, Tara Conley   Account Number:  [id number]  Date Initiated:  04/05/2013  Documentation initiated by:  Lanier Clam  Subjective/Objective Assessment:   62 Y/O F ADMITTED W/SOB.     Action/Plan:   FROM HOME.   Anticipated DC Date:  04/05/2013   Anticipated DC Plan:  HOME/SELF CARE      DC Planning Services  CM consult      Choice offered to / List presented to:             Status of service:  In process, will continue to follow Medicare Important Message given?   (If response is "NO", the following Medicare IM given date fields will be blank) Date Medicare IM given:   Date Additional Medicare IM given:    Discharge Disposition:    Per UR Regulation:  Reviewed for med. necessity/level of care/duration of stay  If discussed at Long Length of Stay Meetings, dates discussed:    Comments:  04/05/13 Laylynn Campanella RN,BSN NCM [phone] NOTED 02 SATS QUALIFIED FOR HOME 02.SPOKE TO [facility] REP AWARE OF HOME 02 NEED,BUT WILL NEED HOME 02 W/LITER FLOW,& DURATION ORDER. NO ANTICIPATED D/C NEEDS.

## 2013-04-10 ENCOUNTER — Emergency Department (HOSPITAL_COMMUNITY)
Admission: EM | Admit: 2013-04-10 | Discharge: 2013-04-10 | Disposition: A | Discharge status: Home / Self Care | Payer: 59 | Attending: Emergency Medicine | Admitting: Emergency Medicine

## 2013-04-10 ENCOUNTER — Emergency Department (HOSPITAL_COMMUNITY): Payer: 59

## 2013-04-10 DIAGNOSIS — R630 Anorexia: Secondary | ICD-10-CM | POA: Insufficient documentation

## 2013-04-10 DIAGNOSIS — F410 Panic disorder [episodic paroxysmal anxiety] without agoraphobia: Secondary | ICD-10-CM | POA: Insufficient documentation

## 2013-04-10 DIAGNOSIS — Z880 Allergy status to penicillin: Secondary | ICD-10-CM | POA: Insufficient documentation

## 2013-04-10 DIAGNOSIS — IMO0002 Reserved for concepts with insufficient information to code with codable children: Secondary | ICD-10-CM | POA: Insufficient documentation

## 2013-04-10 DIAGNOSIS — R209 Unspecified disturbances of skin sensation: Secondary | ICD-10-CM | POA: Insufficient documentation

## 2013-04-10 DIAGNOSIS — Z8701 Personal history of pneumonia (recurrent): Secondary | ICD-10-CM | POA: Insufficient documentation

## 2013-04-10 DIAGNOSIS — Z859 Personal history of malignant neoplasm, unspecified: Secondary | ICD-10-CM | POA: Insufficient documentation

## 2013-04-10 DIAGNOSIS — G8929 Other chronic pain: Secondary | ICD-10-CM | POA: Insufficient documentation

## 2013-04-10 DIAGNOSIS — Z8739 Personal history of other diseases of the musculoskeletal system and connective tissue: Secondary | ICD-10-CM | POA: Insufficient documentation

## 2013-04-10 DIAGNOSIS — Z79899 Other long term (current) drug therapy: Secondary | ICD-10-CM | POA: Insufficient documentation

## 2013-04-10 DIAGNOSIS — R0682 Tachypnea, not elsewhere classified: Secondary | ICD-10-CM | POA: Insufficient documentation

## 2013-04-10 DIAGNOSIS — I10 Essential (primary) hypertension: Secondary | ICD-10-CM | POA: Insufficient documentation

## 2013-04-10 DIAGNOSIS — Z792 Long term (current) use of antibiotics: Secondary | ICD-10-CM | POA: Insufficient documentation

## 2013-04-10 LAB — COMPREHENSIVE METABOLIC PANEL
Albumin: 4.4 g/dL (ref 3.5–5.2)
CO2: 22 mEq/L (ref 19–32)
Calcium: 10.3 mg/dL (ref 8.4–10.5)
Creatinine, Ser: 0.79 mg/dL (ref 0.50–1.10)
GFR calc Af Amer: >90 mL/min (ref >90)
GFR calc non Af Amer: 88 mL/min — ABNORMAL LOW (ref >90)
Glucose, Bld: 127 mg/dL — ABNORMAL HIGH (ref 70–99)
Total Protein: 8.1 g/dL (ref 6.0–8.3)

## 2013-04-10 LAB — CBC WITH DIFFERENTIAL/PLATELET
Basophils Relative: 0 % (ref 0–1)
Eosinophils Relative: 2 % (ref 0–5)
HCT: 49.4 % — ABNORMAL HIGH (ref 36.0–46.0)
Lymphocytes Relative: 31 % (ref 12–46)
Lymphs Abs: 3.8 10*3/uL (ref 0.7–4.0)
MCV: 84.4 fL (ref 78.0–100.0)
Monocytes Absolute: 1.1 10*3/uL — ABNORMAL HIGH (ref 0.1–1.0)
Neutrophils Relative %: 59 % (ref 43–77)
RBC: 5.85 MIL/uL — ABNORMAL HIGH (ref 3.87–5.11)
WBC: 12.4 10*3/uL — ABNORMAL HIGH (ref 4.0–10.5)

## 2013-04-10 MED ORDER — LORAZEPAM 2 MG/ML IJ SOLN
1.0000 mg | Freq: Once | INTRAMUSCULAR | Status: AC
  Administered 2013-04-10: 1 mg via INTRAVENOUS
  Filled 2013-04-10: qty 1

## 2013-04-10 MED ORDER — ALBUTEROL SULFATE (5 MG/ML) 0.5% IN NEBU
5.0000 mg | INHALATION_SOLUTION | Freq: Once | RESPIRATORY_TRACT | Status: AC
  Administered 2013-04-10: 5 mg via RESPIRATORY_TRACT
  Filled 2013-04-10: qty 1

## 2013-04-10 MED ORDER — IPRATROPIUM BROMIDE 0.02 % IN SOLN
0.5000 mg | Freq: Once | RESPIRATORY_TRACT | Status: AC
  Administered 2013-04-10: 0.5 mg via RESPIRATORY_TRACT
  Filled 2013-04-10: qty 2.5

## 2013-04-10 NOTE — ED Provider Notes (Signed)
CSN: [id number]     Arrival date & time 04/10/13  2022 History   First MD Initiated Contact with Patient 04/10/13 2023     Chief Complaint  Patient presents with  . Shortness of Breath    HPI Patient reports acute onset shortness of breath this evening.  She also had an episode earlier this morning that was similar which required EMS to come to the house.  At that time EMS reported to the husband that she was breathing 40-50 times a minute with coaching this was normalized and her breathing returned to normal at home.  This is deemed a panic attack at that time.  He states that she did fine throughout the day and then he returned home from work and she acutely developed shortness of breath again.  No history of asthma or COPD.  No history of DVT or pulmonary embolism.  No recent surgery or long travel.  No unilateral leg swelling.  The patient was recently hospitalized for similar symptoms at that time had a CT angiogram which demonstrated no evidence of pulmonary embolism.  No recent changes in her mood.  The patient is somewhat tearful at this time.  She reports numbness and tingling of her hands and legs at this time.  No fevers or chills.  No productive cough.  Normal appetite over the past several days.  Husband also reports that one week ago she had similar symptoms.   Past Medical History  Diagnosis Date  . Back pain, chronic   . Herniated disc   . Bulging disc   . Pneumonia   . Seasonal allergies   . Hypertension   . Cancer    Past Surgical History  Procedure Laterality Date  . Abdominal hysterectomy    . Knee surgery    . Hand surgery    . Tonsillectomy     No family history on file. History  Substance Use Topics  . Smoking status: Never Smoker   . Smokeless tobacco: Never Used  . Alcohol Use: No   OB History   Grav Para Term Preterm Abortions TAB SAB Ect Mult Living                 Review of Systems  All other systems reviewed and are negative.    Allergies   Haloperidol and related; Penicillins; and Nickel  Home Medications   Current Outpatient Rx  Name  Route  Sig  Dispense  Refill  . acyclovir ointment (ZOVIRAX) 5 %   Topical   Apply topically every 3 (three) hours. Apply to affected area on face. Use for 10 days.   15 g   0   . albuterol (PROVENTIL HFA;VENTOLIN HFA) 108 (90 BASE) MCG/ACT inhaler   Inhalation   Inhale 1-2 puffs into the lungs every 6 (six) hours as needed for wheezing.   1 Inhaler   0   . Bepotastine Besilate 1.5 % SOLN   Both Eyes   Place 2 drops into both eyes 2 (two) times daily.         . diazepam (VALIUM) 5 MG tablet   Oral   Take 1 tablet (5 mg total) by mouth every 12 (twelve) hours as needed.   30 tablet   0   . doxycycline (VIBRA-TABS) 100 MG tablet   Oral   Take 1 tablet (100 mg total) by mouth every 12 (twelve) hours.   14 tablet   0   . fluticasone (FLONASE) 50 MCG/ACT nasal spray  Nasal   Place 2 sprays into the nose 2 (two) times daily.         . hydrALAZINE (APRESOLINE) 50 MG tablet   Oral   Take 1 tablet (50 mg total) by mouth 3 (three) times daily.   90 tablet   3   . HYDROcodone-acetaminophen (NORCO/VICODIN) 5-325 MG per tablet   Oral   Take 1 tablet by mouth every 6 (six) hours as needed for pain.   30 tablet   0   . HYDROmorphone (DILAUDID) 2 MG tablet   Oral   Take 1 tablet (2 mg total) by mouth every 4 (four) hours as needed for pain.   30 tablet   0   . montelukast (SINGULAIR) 10 MG tablet   Oral   Take 10 mg by mouth at bedtime.         . OxyCODONE (OXYCONTIN) 10 mg T12A 12 hr tablet   Oral   Take 1 tablet (10 mg total) by mouth every 12 (twelve) hours.   60 tablet   0   . promethazine (PHENERGAN) 25 MG tablet   Oral   Take 25 mg by mouth every 6 (six) hours as needed for nausea.         Marland Kitchen tiZANidine (ZANAFLEX) 4 MG tablet   Oral   Take 4 mg by mouth 2 (two) times daily.           BP 152/95  Pulse 106  Temp(Src) 98.6 F (37 C) (Oral)   Resp 19  SpO2 94% Physical Exam  Nursing note and vitals reviewed. Constitutional: She is oriented to person, place, and time. She appears well-developed and well-nourished. No distress.  HENT:  Head: Normocephalic and atraumatic.  Eyes: EOM are normal.  Neck: Normal range of motion.  Cardiovascular: Normal rate, regular rhythm and normal heart sounds.   Pulmonary/Chest: Effort normal and breath sounds normal. She has no wheezes. She has no rales.  Tachypnea  Abdominal: Soft. She exhibits no distension. There is no tenderness.  Musculoskeletal: Normal range of motion.  Neurological: She is alert and oriented to person, place, and time.  Skin: Skin is warm and dry.  Psychiatric:  Anxious and tearful    ED Course  Procedures (including critical care time) Labs Review Labs Reviewed  CBC WITH DIFFERENTIAL - Abnormal; Notable for the following:    WBC 12.4 (*)    RBC 5.85 (*)    Hemoglobin 16.2 (*)    HCT 49.4 (*)    RDW 16.5 (*)    Monocytes Absolute 1.1 (*)    All other components within normal limits  COMPREHENSIVE METABOLIC PANEL - Abnormal; Notable for the following:    Sodium 133 (*)    Glucose, Bld 127 (*)    GFR calc non Af Amer 88 (*)    All other components within normal limits   Imaging Review Dg Chest 2 View  04/10/2013   CLINICAL DATA:  Shortness of breath.  EXAM: CHEST  2 VIEW  COMPARISON:  Chest x-ray 04/04/2013.  FINDINGS: Lung volumes are normal. Linear opacities are throughout the mid to lower lungs bilaterally, most pronounced on the lateral projection, where they appear to correspond to interlobar fissures, likely to represent chronic scarring. No acute consolidative airspace disease. No pleural effusions. No evidence of pulmonary edema. Mild cardiomegaly. Upper mediastinal contours are within limits.  IMPRESSION: 1. No definite radiographic evidence of acute cardiopulmonary disease. 2. Areas mild chronic scarring throughout the lungs bilaterally redemonstrated,  as  above. 3. Mild cardiomegaly.   Electronically Signed   By: Trudie Reed M.D.   On: 04/10/2013 21:15  I personally reviewed the imaging tests through PACS system I reviewed available ER/hospitalization records through the EMR   EKG Interpretation     Ventricular Rate:  121 PR Interval:  137 QRS Duration: 85 QT Interval:  326 QTC Calculation: 462 R Axis:   -38 Text Interpretation:  Sinus tachycardia Multiple premature complexes, vent  Probable left atrial enlargement Left axis deviation Abnormal R-wave progression, late transition Baseline wander in lead(s) II III aVF No significant change was found            MDM   1. Anxiety attack    10:08 PM Patient feels much better at this time.  The patient's respiratory rate is normal. I was pulled aside by the patient's husband states the patient is a long standing history of anxiety since the patient's son committed suicide 10 years ago.  He states that in between these 2 episodes today she was completely normal and only when he returned from work and they started arguing again did her shortness of breath return    Lyanne Co, MD 04/10/13 2212

## 2013-04-14 ENCOUNTER — Other Ambulatory Visit: Payer: Self-pay

## 2013-04-14 ENCOUNTER — Observation Stay (HOSPITAL_COMMUNITY)
Admission: EM | Admit: 2013-04-14 | Discharge: 2013-04-15 | Disposition: A | Discharge status: Home / Self Care | Payer: 59 | Attending: Internal Medicine | Admitting: Internal Medicine

## 2013-04-14 ENCOUNTER — Emergency Department (HOSPITAL_COMMUNITY): Payer: 59

## 2013-04-14 ENCOUNTER — Encounter (HOSPITAL_COMMUNITY): Payer: Self-pay | Admitting: Emergency Medicine

## 2013-04-14 DIAGNOSIS — I498 Other specified cardiac arrhythmias: Secondary | ICD-10-CM | POA: Insufficient documentation

## 2013-04-14 DIAGNOSIS — I10 Essential (primary) hypertension: Secondary | ICD-10-CM | POA: Insufficient documentation

## 2013-04-14 DIAGNOSIS — R209 Unspecified disturbances of skin sensation: Secondary | ICD-10-CM | POA: Insufficient documentation

## 2013-04-14 DIAGNOSIS — Z9109 Other allergy status, other than to drugs and biological substances: Secondary | ICD-10-CM | POA: Insufficient documentation

## 2013-04-14 DIAGNOSIS — Z79899 Other long term (current) drug therapy: Secondary | ICD-10-CM | POA: Diagnosis not present

## 2013-04-14 DIAGNOSIS — Z888 Allergy status to other drugs, medicaments and biological substances status: Secondary | ICD-10-CM | POA: Diagnosis not present

## 2013-04-14 DIAGNOSIS — M545 Low back pain, unspecified: Secondary | ICD-10-CM | POA: Insufficient documentation

## 2013-04-14 DIAGNOSIS — F1310 Sedative, hypnotic or anxiolytic abuse, uncomplicated: Secondary | ICD-10-CM | POA: Insufficient documentation

## 2013-04-14 DIAGNOSIS — E669 Obesity, unspecified: Secondary | ICD-10-CM | POA: Diagnosis not present

## 2013-04-14 DIAGNOSIS — R031 Nonspecific low blood-pressure reading: Secondary | ICD-10-CM | POA: Insufficient documentation

## 2013-04-14 DIAGNOSIS — M549 Dorsalgia, unspecified: Secondary | ICD-10-CM

## 2013-04-14 DIAGNOSIS — Z8701 Personal history of pneumonia (recurrent): Secondary | ICD-10-CM | POA: Diagnosis not present

## 2013-04-14 DIAGNOSIS — R519 Headache, unspecified: Secondary | ICD-10-CM | POA: Diagnosis present

## 2013-04-14 DIAGNOSIS — Z880 Allergy status to penicillin: Secondary | ICD-10-CM | POA: Diagnosis not present

## 2013-04-14 DIAGNOSIS — G8929 Other chronic pain: Secondary | ICD-10-CM | POA: Insufficient documentation

## 2013-04-14 DIAGNOSIS — IMO0002 Reserved for concepts with insufficient information to code with codable children: Secondary | ICD-10-CM | POA: Diagnosis not present

## 2013-04-14 DIAGNOSIS — R200 Anesthesia of skin: Secondary | ICD-10-CM | POA: Diagnosis present

## 2013-04-14 DIAGNOSIS — Z859 Personal history of malignant neoplasm, unspecified: Secondary | ICD-10-CM | POA: Insufficient documentation

## 2013-04-14 DIAGNOSIS — F411 Generalized anxiety disorder: Secondary | ICD-10-CM | POA: Insufficient documentation

## 2013-04-14 DIAGNOSIS — R001 Bradycardia, unspecified: Secondary | ICD-10-CM | POA: Diagnosis present

## 2013-04-14 DIAGNOSIS — R079 Chest pain, unspecified: Secondary | ICD-10-CM | POA: Diagnosis present

## 2013-04-14 DIAGNOSIS — K219 Gastro-esophageal reflux disease without esophagitis: Secondary | ICD-10-CM

## 2013-04-14 DIAGNOSIS — R51 Headache: Secondary | ICD-10-CM | POA: Diagnosis present

## 2013-04-14 DIAGNOSIS — Z90710 Acquired absence of both cervix and uterus: Secondary | ICD-10-CM | POA: Insufficient documentation

## 2013-04-14 DIAGNOSIS — R109 Unspecified abdominal pain: Secondary | ICD-10-CM

## 2013-04-14 DIAGNOSIS — R1012 Left upper quadrant pain: Secondary | ICD-10-CM | POA: Insufficient documentation

## 2013-04-14 DIAGNOSIS — R202 Paresthesia of skin: Secondary | ICD-10-CM | POA: Diagnosis present

## 2013-04-14 LAB — TROPONIN I
Troponin I: <0.3 ng/mL (ref <0.30)
Troponin I: <0.3 ng/mL (ref <0.30)

## 2013-04-14 LAB — COMPREHENSIVE METABOLIC PANEL
ALT: 6 U/L (ref 0–35)
AST: 13 U/L (ref 0–37)
CO2: 21 mEq/L (ref 19–32)
Calcium: 9.5 mg/dL (ref 8.4–10.5)
Chloride: 100 mEq/L (ref 96–112)
Creatinine, Ser: 0.76 mg/dL (ref 0.50–1.10)
GFR calc Af Amer: >90 mL/min (ref >90)
GFR calc non Af Amer: 89 mL/min — ABNORMAL LOW (ref >90)
Glucose, Bld: 148 mg/dL — ABNORMAL HIGH (ref 70–99)
Sodium: 134 mEq/L — ABNORMAL LOW (ref 135–145)
Total Protein: 6.9 g/dL (ref 6.0–8.3)

## 2013-04-14 LAB — CBC
HCT: 44.1 % (ref 36.0–46.0)
HCT: 45.7 % (ref 36.0–46.0)
MCH: 28.2 pg (ref 26.0–34.0)
MCH: 27.5 pg (ref 26.0–34.0)
MCHC: 33.1 g/dL (ref 30.0–36.0)
MCV: 85.1 fL (ref 78.0–100.0)
MCV: 87.2 fL (ref 78.0–100.0)
Platelets: 290 10*3/uL (ref 150–400)
Platelets: 287 10*3/uL (ref 150–400)
RBC: 5.24 MIL/uL — ABNORMAL HIGH (ref 3.87–5.11)
RDW: 16.2 % — ABNORMAL HIGH (ref 11.5–15.5)
WBC: 10.8 10*3/uL — ABNORMAL HIGH (ref 4.0–10.5)
WBC: 9.6 10*3/uL (ref 4.0–10.5)

## 2013-04-14 LAB — URINALYSIS, ROUTINE W REFLEX MICROSCOPIC
Bilirubin Urine: NEGATIVE
Glucose, UA: NEGATIVE mg/dL
Ketones, ur: NEGATIVE mg/dL
Leukocytes, UA: NEGATIVE
Protein, ur: NEGATIVE mg/dL
pH: 5.5 (ref 5.0–8.0)

## 2013-04-14 LAB — RAPID URINE DRUG SCREEN, HOSP PERFORMED
Amphetamines: NOT DETECTED
Benzodiazepines: POSITIVE — AB
Cocaine: NOT DETECTED

## 2013-04-14 LAB — DIFFERENTIAL
Basophils Absolute: 0 10*3/uL (ref 0.0–0.1)
Eosinophils Absolute: 0.2 10*3/uL (ref 0.0–0.7)
Eosinophils Relative: 2 % (ref 0–5)
Lymphocytes Relative: 23 % (ref 12–46)
Monocytes Absolute: 0.8 10*3/uL (ref 0.1–1.0)
Neutrophils Relative %: 68 % (ref 43–77)

## 2013-04-14 LAB — CREATININE, SERUM: GFR calc Af Amer: 88 mL/min — ABNORMAL LOW (ref >90)

## 2013-04-14 LAB — GLUCOSE, CAPILLARY

## 2013-04-14 MED ORDER — ONDANSETRON HCL 4 MG PO TABS: 4.0000 mg | ORAL_TABLET | Freq: Four times a day (QID) | ORAL | Status: DC | PRN

## 2013-04-14 MED ORDER — ONDANSETRON HCL 4 MG/2ML IJ SOLN: 4.0000 mg | Freq: Four times a day (QID) | INTRAMUSCULAR | Status: DC | PRN

## 2013-04-14 MED ORDER — OXYCODONE HCL 5 MG PO TABS
5.0000 mg | ORAL_TABLET | Freq: Four times a day (QID) | ORAL | Status: DC | PRN
  Administered 2013-04-14 – 2013-04-15 (×2): 10 mg via ORAL
  Filled 2013-04-14 (×2): qty 2

## 2013-04-14 MED ORDER — ACETAMINOPHEN 325 MG PO TABS
650.0000 mg | ORAL_TABLET | Freq: Four times a day (QID) | ORAL | Status: DC | PRN
  Administered 2013-04-14 – 2013-04-15 (×2): 650 mg via ORAL
  Filled 2013-04-14 (×2): qty 2

## 2013-04-14 MED ORDER — OXYCODONE HCL 5 MG PO TABS
5.0000 mg | ORAL_TABLET | Freq: Four times a day (QID) | ORAL | Status: DC | PRN
  Administered 2013-04-14: 5 mg via ORAL
  Filled 2013-04-14: qty 1

## 2013-04-14 MED ORDER — MONTELUKAST SODIUM 10 MG PO TABS
10.0000 mg | ORAL_TABLET | Freq: Every day | ORAL | Status: DC
  Administered 2013-04-14: 10 mg via ORAL
  Filled 2013-04-14 (×2): qty 1

## 2013-04-14 MED ORDER — PANTOPRAZOLE SODIUM 40 MG PO TBEC
40.0000 mg | DELAYED_RELEASE_TABLET | Freq: Two times a day (BID) | ORAL | Status: DC
  Administered 2013-04-14 – 2013-04-15 (×2): 40 mg via ORAL
  Filled 2013-04-14 (×2): qty 1

## 2013-04-14 MED ORDER — SODIUM CHLORIDE 0.9 % IV SOLN
INTRAVENOUS | Status: AC
  Administered 2013-04-14: 1000 mL via INTRAVENOUS

## 2013-04-14 MED ORDER — HYDROMORPHONE HCL PF 1 MG/ML IJ SOLN
1.0000 mg | Freq: Once | INTRAMUSCULAR | Status: AC
  Administered 2013-04-14: 1 mg via INTRAVENOUS
  Filled 2013-04-14: qty 1

## 2013-04-14 MED ORDER — OLOPATADINE HCL 0.1 % OP SOLN
1.0000 [drp] | Freq: Two times a day (BID) | OPHTHALMIC | Status: DC
  Administered 2013-04-14 – 2013-04-15 (×2): 1 [drp] via OPHTHALMIC
  Filled 2013-04-14: qty 5

## 2013-04-14 MED ORDER — ALBUTEROL SULFATE HFA 108 (90 BASE) MCG/ACT IN AERS
1.0000 | INHALATION_SPRAY | Freq: Four times a day (QID) | RESPIRATORY_TRACT | Status: DC | PRN
  Filled 2013-04-14: qty 6.7

## 2013-04-14 MED ORDER — FLUTICASONE PROPIONATE 50 MCG/ACT NA SUSP
2.0000 | Freq: Two times a day (BID) | NASAL | Status: DC
  Administered 2013-04-14 – 2013-04-15 (×2): 2 via NASAL
  Filled 2013-04-14: qty 16

## 2013-04-14 MED ORDER — ACETAMINOPHEN 650 MG RE SUPP: 650.0000 mg | Freq: Four times a day (QID) | RECTAL | Status: DC | PRN

## 2013-04-14 MED ORDER — HEPARIN SODIUM (PORCINE) 5000 UNIT/ML IJ SOLN
5000.0000 [IU] | Freq: Three times a day (TID) | INTRAMUSCULAR | Status: DC
  Administered 2013-04-14 – 2013-04-15 (×3): 5000 [IU] via SUBCUTANEOUS
  Filled 2013-04-14 (×6): qty 1

## 2013-04-14 MED ORDER — SODIUM CHLORIDE 0.9 % IV BOLUS (SEPSIS)
1000.0000 mL | Freq: Once | INTRAVENOUS | Status: AC
  Administered 2013-04-14: 1000 mL via INTRAVENOUS

## 2013-04-14 MED ORDER — SODIUM CHLORIDE 0.9 % IV SOLN
INTRAVENOUS | Status: DC
  Administered 2013-04-14: 14:00:00 via INTRAVENOUS

## 2013-04-14 MED ORDER — LORAZEPAM 0.5 MG PO TABS
0.5000 mg | ORAL_TABLET | Freq: Once | ORAL | Status: AC
  Administered 2013-04-14: 0.5 mg via ORAL
  Filled 2013-04-14: qty 1

## 2013-04-14 MED ORDER — TIZANIDINE HCL 2 MG PO TABS
2.0000 mg | ORAL_TABLET | Freq: Three times a day (TID) | ORAL | Status: DC | PRN
  Filled 2013-04-14: qty 1

## 2013-04-14 MED ORDER — ONDANSETRON HCL 4 MG/2ML IJ SOLN
4.0000 mg | Freq: Once | INTRAMUSCULAR | Status: AC
  Administered 2013-04-14: 4 mg via INTRAVENOUS
  Filled 2013-04-14: qty 2

## 2013-04-14 MED ORDER — SODIUM CHLORIDE 0.9 % IJ SOLN: 3.0000 mL | Freq: Two times a day (BID) | INTRAMUSCULAR | Status: DC

## 2013-04-14 MED ORDER — HYDROMORPHONE HCL PF 1 MG/ML IJ SOLN
1.0000 mg | Freq: Once | INTRAMUSCULAR | Status: DC
  Filled 2013-04-14: qty 1

## 2013-04-14 NOTE — H&P (Signed)
[facility] History and Physical  Alec Mcphee JYN:[id number] DOB: Sep 14, 1950 DOA: 04/14/2013  Referring physician: Dr. Effie Shy PCP: No PCP Per Patient    Chief Complaint: abd pain, chest pain, ha's and numbness  HPI: Tara Conley is a 62 y.o. female with pmh significant for chronic back pain, seasonal allergies/allergic rhinitis/asthma; obesity, HTN and GERD; came to ED complaining of Ha's and dizziness. Patient also complaining of epigastric/chest pain and back pain (this chronic). In ED blood work unremarkable and images studies from 04/13/13 unremarkable. Patient denies fever, chills, nausea, vomiting, palpitations, blurred vision, focal deficit or any complaints. Due to chest pain, bradycardia and soft BP TRH asked to admit patient for further evaluation and treatment.   Review of Systems:  Numbness and her arms and legs, otherwise negative except as mentioned on HPI.  Past Medical History  Diagnosis Date  . Back pain, chronic   . Herniated disc   . Bulging disc   . Pneumonia   . Seasonal allergies   . Hypertension   . Cancer    Past Surgical History  Procedure Laterality Date  . Abdominal hysterectomy    . Knee surgery    . Hand surgery    . Tonsillectomy     Social History:  reports that she has never smoked. She has never used smokeless tobacco. She reports that she does not drink alcohol or use illicit drugs.   Allergies  Allergen Reactions  . Haloperidol And Related Swelling    Tongue swelling  . Penicillins Hives  . Nickel Rash    Family history: mother with throat cancer and father with HTN; otherwise no contributory.  Prior to Admission medications   Medication Sig Start Date End Date Taking? Authorizing Provider  albuterol (PROVENTIL HFA;VENTOLIN HFA) 108 (90 BASE) MCG/ACT inhaler Inhale 1-2 puffs into the lungs every 6 (six) hours as needed for wheezing. 04/05/13  Yes Alison Murray, MD  Bepotastine Besilate 1.5 % SOLN Place 2 drops into both  eyes 2 (two) times daily.   Yes Historical Provider, MD  fluticasone (FLONASE) 50 MCG/ACT nasal spray Place 2 sprays into the nose 2 (two) times daily.   Yes Historical Provider, MD  montelukast (SINGULAIR) 10 MG tablet Take 10 mg by mouth at bedtime.   Yes Historical Provider, MD  promethazine (PHENERGAN) 25 MG tablet Take 25 mg by mouth every 6 (six) hours as needed for nausea.   Yes Historical Provider, MD  tiZANidine (ZANAFLEX) 4 MG tablet Take 4 mg by mouth 2 (two) times daily.    Yes Historical Provider, MD   Physical Exam: Filed Vitals:   04/14/13 1601  BP: 101/51  Pulse: 46  Temp:   Resp:      General:  NAD, complaining of pain in her back and epigastric area. No fever.  Eyes: PERRL, EOMI, no icterus, no nystagmus  ENT: dry lips, no erythema, no exudates, no drainage out of ears or nostrils  Neck: no JVD, no bruits  Cardiovascular: S1 and S2, no rubs or gallops  Respiratory: CTA bilaterally  Abdomen: tender to palpation on epigastric area, no rebound, positive BS, no distension  Skin: no rash or petechiae  Musculoskeletal: no Le edema, no cyanosis  Psychiatric: no SI, or hallucinations  Neurologic: CN intact, no focal motor or sensory deficit; move four limbs and follow commands.  Labs on Admission:  Basic Metabolic Panel:  Recent Labs Lab 04/10/13 2045 04/14/13 1209  NA 133* 134*  K 3.8 3.5  CL 97 100  CO2 22 21  GLUCOSE 127* 148*  BUN 14 18  CREATININE 0.79 0.76  CALCIUM 10.3 9.5   Liver Function Tests:  Recent Labs Lab 04/10/13 2045 04/14/13 1209  AST 20 13  ALT 11 6  ALKPHOS 107 90  BILITOT 0.8 0.7  PROT 8.1 6.9  ALBUMIN 4.4 3.4*   CBC:  Recent Labs Lab 04/10/13 2045 04/14/13 1209  WBC 12.4* 10.8*  NEUTROABS 7.3 7.3  HGB 16.2* 14.6  HCT 49.4* 44.1  MCV 84.4 85.1  PLT 340 290   Cardiac Enzymes:  Recent Labs Lab 04/14/13 1209  TROPONINI <0.30    BNP (last 3 results)  Recent Labs  04/04/13 1432  PROBNP 218.4*    CBG:  Recent Labs Lab 04/14/13 1223  GLUCAP 136*    Radiological Exams on Admission: Ct Head Wo Contrast  04/14/2013   CLINICAL DATA:  Headache, numbness  EXAM: CT HEAD WITHOUT CONTRAST  TECHNIQUE: Contiguous axial images were obtained from the base of the skull through the vertex without intravenous contrast.  COMPARISON:  11/13/2012  FINDINGS: No skull fracture is noted. Paranasal sinuses and mastoid air cells are unremarkable.  No intracranial hemorrhage, mass effect or midline shift.  No acute infarction. No mass lesion is noted on this unenhanced scan. The gray and white-matter differentiation is preserved. No intra or extra-axial fluid collection.  IMPRESSION: No acute intracranial abnormality. No significant change.   Electronically Signed   By: Natasha Mead M.D.   On: 04/14/2013 12:47    EKG: sinus bradycardia, no acute ischemic changes appreciated.   Assessment/Plan 1-Chest pain, unspecified: atypical and with heart score of 2; most likely GERD -given pain unable to be controlled in ED, bradycardia and soft BP TRH called to admit for further evaluation and treatment. -admit to telemetry -check TSH, 2-D echo and EKG -cycle troponin -start PPI -follow clinical response and results  2-HTN (hypertension), benign: soft on admission; and no needing medications. -will d/c hydralazine -if needed will use amlodipine -give IVf's resuscitation  3-Chronic back pain: continue low dose zanaflex and oxycodone.  4-GERD (gastroesophageal reflux disease): will start tx with PPI  5-Numbness and tingling: diffuse and no focal deficit appreciated on exam. -will check TSH and B12  6-Morbid obesity: patient advise to lose weight and follow a low calorie diet  7-Headache: appears to be secondary to vasodilation from hydralazine most likely. -will use PRN tylenol -will follow clinical response  8-Bradycardia: will check TSH, 2-D echo and follow on telemetry.  9-allergic  rhinitis/asthma/hypoxia: continue oxygen PRN and also home flonase and PRN albuterol. -had appointment with pulmonology service on 12/9 -concerns for OSA/OHS contributing to her breathing disorder.  DVT: heparin.   Code Status: Full Family Communication: no family at bedside Disposition Plan: observation, LOS < 2 midnights, telemetry   Time spent: >30 minutes  Nathon Stefanski [facility] Pager [phone]  If 7PM-7AM, please contact night-coverage [email or web address] Password [facility] 04/14/2013, 4:50 PM

## 2013-04-14 NOTE — ED Notes (Signed)
Pt. Reports that her head is numb and having severe pain,

## 2013-04-14 NOTE — ED Notes (Signed)
Pt keeps calling out for pain medication, it was explained to pt that due to pressure and heart rate being low I could not give her medication prescribed.  Dr. Effie Shy has been notified.

## 2013-04-14 NOTE — ED Notes (Signed)
Dr. Wentz at bedside. 

## 2013-04-14 NOTE — ED Provider Notes (Signed)
CSN: [id number]     Arrival date & time 04/14/13  1103 History   First MD Initiated Contact with Patient 04/14/13 1144     Chief Complaint  Patient presents with  . Headache   (Consider location/radiation/quality/duration/timing/severity/associated sxs/prior Treatment) HPI Comments: Tara Conley is a 62 y.o. female who complains of a headache and numb feeling. These discomforts started. This morning, while watching TV. The headache started gradually. The numbness in his arms, and legs, bilaterally. She was evaluated here, and treated for, anxiety, 4 days ago. She denies fever, chills, nausea, vomiting, chest pain, change in bowel or urinary habits. She has chronic low back pain. She did not take any of her medications this morning. She lives with her husband. He is out of town, hunting, this morning. She presents for evaluation, by EMS. There are no other known modifying factors.   Patient is a 62 y.o. female presenting with headaches. The history is provided by the patient.  Headache   Past Medical History  Diagnosis Date  . Back pain, chronic   . Herniated disc   . Bulging disc   . Pneumonia   . Seasonal allergies   . Hypertension   . Cancer    Past Surgical History  Procedure Laterality Date  . Abdominal hysterectomy    . Knee surgery    . Hand surgery    . Tonsillectomy     No family history on file. History  Substance Use Topics  . Smoking status: Never Smoker   . Smokeless tobacco: Never Used  . Alcohol Use: No   OB History   Grav Para Term Preterm Abortions TAB SAB Ect Mult Living                 Review of Systems  Neurological: Positive for headaches.  All other systems reviewed and are negative.    Allergies  Haloperidol and related; Penicillins; and Nickel  Home Medications   Current Outpatient Rx  Name  Route  Sig  Dispense  Refill  . albuterol (PROVENTIL HFA;VENTOLIN HFA) 108 (90 BASE) MCG/ACT inhaler   Inhalation   Inhale 1-2 puffs into the  lungs every 6 (six) hours as needed for wheezing.   1 Inhaler   0   . Bepotastine Besilate 1.5 % SOLN   Both Eyes   Place 2 drops into both eyes 2 (two) times daily.         . fluticasone (FLONASE) 50 MCG/ACT nasal spray   Nasal   Place 2 sprays into the nose 2 (two) times daily.         . hydrALAZINE (APRESOLINE) 50 MG tablet   Oral   Take 50 mg by mouth daily as needed.         . montelukast (SINGULAIR) 10 MG tablet   Oral   Take 10 mg by mouth at bedtime.         . promethazine (PHENERGAN) 25 MG tablet   Oral   Take 25 mg by mouth every 6 (six) hours as needed for nausea.         Marland Kitchen tiZANidine (ZANAFLEX) 4 MG tablet   Oral   Take 4 mg by mouth 2 (two) times daily.           BP 106/44  Pulse 48  Temp(Src) 97.3 F (36.3 C) (Oral)  Resp 15  SpO2 93% Physical Exam  Nursing note and vitals reviewed. Constitutional: She is oriented to person, place, and time. She appears well-developed.  Obese, appears older than stated age  HENT:  Head: Normocephalic and atraumatic.  Mucous membranes are dry  Eyes: Conjunctivae and EOM are normal. Pupils are equal, round, and reactive to light.  Neck: Normal range of motion and phonation normal. Neck supple.  Cardiovascular: Normal rate, regular rhythm and intact distal pulses.   Pulmonary/Chest: Effort normal and breath sounds normal. She exhibits no tenderness.  Abdominal: Soft. She exhibits no distension. There is no tenderness. There is no guarding.  Musculoskeletal: Normal range of motion.  Neurological: She is alert and oriented to person, place, and time. She exhibits normal muscle tone.  No dysarthria, no aphasia, no ataxia.  Skin: Skin is warm and dry.  Psychiatric: Her behavior is normal.  She is anxious. She has spotty memory.    ED Course  Procedures (including critical care time) Medications  0.9 %  sodium chloride infusion ( Intravenous New Bag/Given 04/14/13 1349)  HYDROmorphone (DILAUDID) injection 1  mg (0 mg Intravenous Hold 04/14/13 1425)  sodium chloride 0.9 % bolus 1,000 mL (0 mLs Intravenous Stopped 04/14/13 1322)  HYDROmorphone (DILAUDID) injection 1 mg (1 mg Intravenous Given 04/14/13 1256)  ondansetron (ZOFRAN) injection 4 mg (4 mg Intravenous Given 04/14/13 1256)   Patient Vitals for the past 24 hrs:  BP Temp Temp src Pulse Resp SpO2  04/14/13 1500 106/44 mmHg - - 48 15 93 %  04/14/13 1429 97/42 mmHg - - - 16 97 %  04/14/13 1406 88/50 mmHg - - - - -  04/14/13 1301 - 97.3 F (36.3 C) - - - -  04/14/13 1125 108/56 mmHg 97.8 F (36.6 C) Oral - 18 92 %  04/14/13 1104 - - - - - 97 %    Review of hospital. Chart indicates discharge 04/05/2013 after an evaluation and treatment for chronic back pain. During that time she was found to be hypoxic and was started on home oxygen treatments. She was discharged on doxycycline and topical acyclovir for possible facial cellulitis and herpes. She the addition of cardio suppressive or vasodilating medication.  3:22 PM Reevaluation with update and discussion. After initial assessment and treatment, an updated evaluation reveals The patient remains uncomfortable. She has required intravenous analgesia for headache. She remains both bradycardic and has borderline hypotension. At this time, she complains of new left upper quadrant abdominal pain, radiating through to her back. She is panting and very anxious. Her heart rate, now, is 56 beats per minute . Tara Conley    3:29 PM-Consult complete with Dr. Gwenlyn Perking. Patient case explained and discussed. He agrees to admit patient for further evaluation and treatment. Call ended at 1550   Labs Review Labs Reviewed  CBC - Abnormal; Notable for the following:    WBC 10.8 (*)    RBC 5.18 (*)    RDW 16.2 (*)    All other components within normal limits  COMPREHENSIVE METABOLIC PANEL - Abnormal; Notable for the following:    Sodium 134 (*)    Glucose, Bld 148 (*)    Albumin 3.4 (*)    GFR calc non Af  Amer 89 (*)    All other components within normal limits  URINE RAPID DRUG SCREEN (HOSP PERFORMED) - Abnormal; Notable for the following:    Benzodiazepines POSITIVE (*)    All other components within normal limits  GLUCOSE, CAPILLARY - Abnormal; Notable for the following:    Glucose-Capillary 136 (*)    All other components within normal limits  ETHANOL  DIFFERENTIAL  URINALYSIS, ROUTINE  W REFLEX MICROSCOPIC  TROPONIN I   Imaging Review Ct Head Wo Contrast  04/14/2013   CLINICAL DATA:  Headache, numbness  EXAM: CT HEAD WITHOUT CONTRAST  TECHNIQUE: Contiguous axial images were obtained from the base of the skull through the vertex without intravenous contrast.  COMPARISON:  11/13/2012  FINDINGS: No skull fracture is noted. Paranasal sinuses and mastoid air cells are unremarkable.  No intracranial hemorrhage, mass effect or midline shift.  No acute infarction. No mass lesion is noted on this unenhanced scan. The gray and white-matter differentiation is preserved. No intra or extra-axial fluid collection.  IMPRESSION: No acute intracranial abnormality. No significant change.   Electronically Signed   By: Natasha Mead M.D.   On: 04/14/2013 12:47    EKG Interpretation     Ventricular Rate:  49 PR Interval:  162 QRS Duration: 99 QT Interval:  519 QTC Calculation: 469 R Axis:   -8 Text Interpretation:  Sinus bradycardia Since last tracing of earlier today has not changed            MDM   1. Headache   2. Paresthesias   3. Bradycardia   4. Abdominal pain     Nonspecific headache, and paresthesias duration in the emergency department. No clear evidence for CVA. Doubt meningitis. Incidental bradycardia and borderline hypotension in a patient with known hypertension. These are somewhat worrisome findings, and will require admission for observation and further evaluation. She had very recent onset of abdominal pain at around 3:20 PM. This is most likely related to her recent  antibiotic treatment.   Nursing Notes Reviewed/ Care Coordinated, and agree without changes. Applicable Imaging Reviewed.  Interpretation of Laboratory Data incorporated into ED treatment plan: Admit    Flint Melter, MD 04/14/13 1554

## 2013-04-14 NOTE — ED Notes (Addendum)
Pt. Was sitting in her chair watching TV and developed a headache, numbness all over her body. 10 :24 and lower back pain which is chronic.  Had an episode of the same 2 days ago and seen here  And DC home. Everything was okay.  Pt. Is alert and oriented X3. Skin is p/w/d.  No neuro deficits noted.

## 2013-04-14 NOTE — ED Notes (Signed)
Pt now states she passed out and fell today at home prior to arrival. Pt states she takes pain medications at home but did not take anything for pain prior to arrival.

## 2013-04-14 NOTE — ED Notes (Signed)
Pt knows that urine is needed 

## 2013-04-14 NOTE — Progress Notes (Signed)
[facility] progress note. Chief complaint. Headache and left facial numbness. History of present illness. This 62 year old female with a history of chronic back pain came to the emergency room complaining of headache and dizziness. Also complaining of epigastric/chest pain and back pain. Time of admission patient was complaining of numbness in her arms and legs. Examination found numbness and tingling diffuse with no focal deficits found. TSH and B12 were ordered and results are pending. Nursing indicated to me that the patient continues to complain of headache though now also complaining of left facial numbness. I came to the bedside to further evaluate the patient. She states it is a numbness and tingling sensation along with headache pain involving the left head and face. Vital signs temperature 97.7, pulse 71, respirations 17, blood pressure 143/66. O2 sats 96%. General appearance. Obese elderly female who is alert and cooperative. She was ambulating independently from the bathroom when I entered the room. Cardiac. Rate and rhythm regular. Lungs. Distant but clear. Abdomen. Soft and obese with positive bowel sounds. Neurologic. Cranial nerves II through XII grossly intact. Upper extremity strength strong and equal. Lower extremity strength strong and equal the patient ambulatory independently. There may be some soft touch deficit to the left face though difficult to tell for sure given the degree of patient anxiety. There's no rash evident. Impression/plan. Problem #1. Headache with left facial numbness. I discussed the case with Doctor Thad Ranger neural hospitalist who felt etiology likely metabolic. In addition to TSH and B12 she recommends running heavy metal screen, sed rate, and C-reactive protein. I have initiated these orders. I've asked nursing to continue neuro checks overnight and notify me of any facial weakness or asymmetry. Problem #2. Back pain, headache. I've increased OxyContin  from 5 mg to 5-10 mg as needed for pain. Problem #3. Anxiety. I have provided a dose of Ativan 0.5 mg by mouth as a one-time order.

## 2013-04-15 DIAGNOSIS — I517 Cardiomegaly: Secondary | ICD-10-CM

## 2013-04-15 LAB — D-DIMER, QUANTITATIVE: D-Dimer, Quant: 0.59 ug/mL-FEU — ABNORMAL HIGH (ref 0.00–0.48)

## 2013-04-15 LAB — CBC
HCT: 45.4 % (ref 36.0–46.0)
Hemoglobin: 14.7 g/dL (ref 12.0–15.0)
MCH: 28.2 pg (ref 26.0–34.0)
MCV: 87 fL (ref 78.0–100.0)
Platelets: 265 10*3/uL (ref 150–400)
RBC: 5.22 MIL/uL — ABNORMAL HIGH (ref 3.87–5.11)
RDW: 16.6 % — ABNORMAL HIGH (ref 11.5–15.5)
WBC: 12.8 10*3/uL — ABNORMAL HIGH (ref 4.0–10.5)

## 2013-04-15 LAB — TSH: TSH: 1.962 u[IU]/mL (ref 0.350–4.500)

## 2013-04-15 LAB — BASIC METABOLIC PANEL
CO2: 19 mEq/L (ref 19–32)
Calcium: 9.3 mg/dL (ref 8.4–10.5)
Creatinine, Ser: 0.88 mg/dL (ref 0.50–1.10)
GFR calc non Af Amer: 69 mL/min — ABNORMAL LOW (ref >90)
Sodium: 139 mEq/L (ref 135–145)

## 2013-04-15 LAB — TROPONIN I
Troponin I: <0.3 ng/mL (ref <0.30)
Troponin I: <0.3 ng/mL (ref <0.30)

## 2013-04-15 LAB — VITAMIN B12: Vitamin B-12: 698 pg/mL (ref 211–911)

## 2013-04-15 LAB — C-REACTIVE PROTEIN: CRP: <0.5 mg/dL — ABNORMAL LOW (ref <0.60)

## 2013-04-15 MED ORDER — ANTIPYRINE-BENZOCAINE 5.4-1.4 % OT SOLN
2.0000 [drp] | OTIC | Status: DC
  Filled 2013-04-15: qty 10

## 2013-04-15 MED ORDER — LORAZEPAM 1 MG PO TABS: 1.0000 mg | ORAL_TABLET | Freq: Three times a day (TID) | ORAL | Status: DC | PRN

## 2013-04-15 MED ORDER — HYDROMORPHONE HCL PF 1 MG/ML IJ SOLN
0.5000 mg | Freq: Once | INTRAMUSCULAR | Status: AC
  Administered 2013-04-15: 0.5 mg via INTRAVENOUS
  Filled 2013-04-15: qty 1

## 2013-04-15 MED ORDER — SALINE SPRAY 0.65 % NA SOLN
1.0000 | NASAL | Status: DC | PRN
  Administered 2013-04-15: 1 via NASAL
  Filled 2013-04-15: qty 44

## 2013-04-15 MED ORDER — TIZANIDINE HCL 4 MG PO TABS
4.0000 mg | ORAL_TABLET | Freq: Three times a day (TID) | ORAL | Status: DC
  Filled 2013-04-15 (×3): qty 1

## 2013-04-15 MED ORDER — CEFPODOXIME PROXETIL 200 MG PO TABS
200.0000 mg | ORAL_TABLET | Freq: Two times a day (BID) | ORAL | Status: DC
Start: 2013-04-15 — End: 2013-04-15
  Filled 2013-04-15 (×2): qty 1

## 2013-04-15 NOTE — Progress Notes (Signed)
*  PRELIMINARY RESULTS* Echocardiogram 2D Echocardiogram has been performed.  Dorothey Baseman 04/15/2013, 10:18 AM

## 2013-04-15 NOTE — Progress Notes (Signed)
Patient ID: Tara Conley  female  OZH:[id number]    DOB: 10-20-1950    DOA: 04/14/2013  PCP: No PCP Per Patient  Assessment/Plan: Principal Problem:   Chest pain, unspecified -Patient is a very vague historian, at the time of my encounter states that she did not have any chest pain at the time of admission however she has been ruled out for acute ACS with negative troponins  - D-dimer was somewhat elevated, CT angiogram of the chest ordered to rule out PE  - EKG showed rate of 49, sinus bradycardia there was normal EKG - 2-D echo results pending  Left ear pain : - At the time of my encounter, patient was claiming of left ear pain, no ear discharge noted with the otoscope or any bulging tympanic membrane, but patient had significant pain. Placed on Auralgan ear drops and Vantin    HTN (hypertension), benign: Elevated likely due to anxiety and pain issues   Acute on Chronic back pain: In lumbar region, radiating to right leg - Ordered MRI of the lumbar spine, rule out any nerve root compression, continue pain control    GERD (gastroesophageal reflux disease) - Continue PPI  Headache with left facial numbness: Resolved Patient had complained of headache with left facial numbness at night, NP had discussed with neurologist on call Dr. Thad Ranger, so recommended metabolic/vasculitis workup. patient had no focal neurological deficits. At the time of my examination, patient did not complain of any headache or facial numbness or tingling, has no FND's, only complaining of left ear pain, back pain and right leg pain     Morbid obesity - Patient counseled on diet and weight control  DVT Prophylaxis:  Code Status:  Disposition:Not medically ready    Subjective: At the time of my encounter, complaining of left ear pain, low back pain, radiating to right leg, no facial numbness, no numbness and tingling in the legs no urinary retention or incontinence, no headaches.   Objective: Weight  change:   Intake/Output Summary (Last 24 hours) at 04/15/13 1049 Last data filed at 04/15/13 0544  Gross per 24 hour  Intake   1245 ml  Output      0 ml  Net   1245 ml   Blood pressure 169/58, pulse 103, temperature 98.8 F (37.1 C), temperature source Oral, resp. rate 18, SpO2 93.00%.  Physical Exam: General: Alert and awake, oriented x3, not in any acute distress. HEENT: anicteric sclera, PERLA, EOMI. Both the ears examined with otoscope, patient had pain with the touching the left ear however no bulging membrane or ear discharge, right ear fine  CVS: S1-S2 clear, no murmur rubs or gallops Chest: clear to auscultation bilaterally, no wheezing, rales or rhonchi Abdomen:Morbidly obese, soft nontender, nondistended, normal bowel sounds  Back : States L4-L5 paraspinal region tenderness  Extremities: no cyanosis, clubbing or edema noted bilaterally Neuro: Cranial nerves II-XII intact, no focal neurological deficits  Lab Results: Basic Metabolic Panel:  Recent Labs Lab 04/14/13 1209 04/14/13 1814 04/15/13 0500  NA 134*  --  139  K 3.5  --  4.3  CL 100  --  106  CO2 21  --  19  GLUCOSE 148*  --  106*  BUN 18  --  18  CREATININE 0.76 0.82 0.88  CALCIUM 9.5  --  9.3   Liver Function Tests:  Recent Labs Lab 04/10/13 2045 04/14/13 1209  AST 20 13  ALT 11 6  ALKPHOS 107 90  BILITOT 0.8 0.7  PROT 8.1 6.9  ALBUMIN 4.4 3.4*    Recent Labs Lab 04/14/13 1814  LIPASE 18   No results found for this basename: AMMONIA,  in the last 168 hours CBC:  Recent Labs Lab 04/14/13 1209 04/14/13 1814 04/15/13 0500  WBC 10.8* 9.6 12.8*  NEUTROABS 7.3  --   --   HGB 14.6 14.4 14.7  HCT 44.1 45.7 45.4  MCV 85.1 87.2 87.0  PLT 290 287 265   Cardiac Enzymes:  Recent Labs Lab 04/14/13 1814 04/15/13 0040 04/15/13 0500  TROPONINI <0.30 <0.30 <0.30   BNP: No components found with this basename: POCBNP,  CBG:  Recent Labs Lab 04/14/13 1223  GLUCAP 136*     Micro  Results: No results found for this or any previous visit (from the past 240 hour(s)).  Studies/Results: Dg Chest 2 View  04/10/2013   CLINICAL DATA:  Shortness of breath.  EXAM: CHEST  2 VIEW  COMPARISON:  Chest x-ray 04/04/2013.  FINDINGS: Lung volumes are normal. Linear opacities are throughout the mid to lower lungs bilaterally, most pronounced on the lateral projection, where they appear to correspond to interlobar fissures, likely to represent chronic scarring. No acute consolidative airspace disease. No pleural effusions. No evidence of pulmonary edema. Mild cardiomegaly. Upper mediastinal contours are within limits.  IMPRESSION: 1. No definite radiographic evidence of acute cardiopulmonary disease. 2. Areas mild chronic scarring throughout the lungs bilaterally redemonstrated, as above. 3. Mild cardiomegaly.   Electronically Signed   By: Trudie Reed M.D.   On: 04/10/2013 21:15   Dg Chest 2 View  04/04/2013   CLINICAL DATA:  Shortness of breath, cough.  EXAM: CHEST  2 VIEW  COMPARISON:  November 06, 2009.  FINDINGS: Stable cardiomediastinal silhouette. No pleural effusion or pneumothorax is noted. Stable scarring is noted in left midlung. No acute pulmonary disease is noted.  IMPRESSION: No acute cardiopulmonary abnormality seen.   Electronically Signed   By: Roque Lias M.D.   On: 04/04/2013 15:34   Ct Head Wo Contrast  04/14/2013   CLINICAL DATA:  Headache, numbness  EXAM: CT HEAD WITHOUT CONTRAST  TECHNIQUE: Contiguous axial images were obtained from the base of the skull through the vertex without intravenous contrast.  COMPARISON:  11/13/2012  FINDINGS: No skull fracture is noted. Paranasal sinuses and mastoid air cells are unremarkable.  No intracranial hemorrhage, mass effect or midline shift.  No acute infarction. No mass lesion is noted on this unenhanced scan. The gray and white-matter differentiation is preserved. No intra or extra-axial fluid collection.  IMPRESSION: No acute  intracranial abnormality. No significant change.   Electronically Signed   By: Natasha Mead M.D.   On: 04/14/2013 12:47   Ct Angio Chest Pe W/cm &/or Wo Cm  04/05/2013   CLINICAL DATA:  Shortness of Breath  EXAM: CT ANGIOGRAPHY CHEST WITH CONTRAST  TECHNIQUE: Multidetector CT imaging of the chest was performed using the standard protocol during bolus administration of intravenous contrast. Multiplanar CT image reconstructions including MIPs were obtained to evaluate the vascular anatomy.  CONTRAST:  OMNIPAQUE IOHEXOL 350 MG/ML SOLN  COMPARISON:  Chest radiograph April 04, 2013  FINDINGS: The there is no demonstrable pulmonary embolus. There is no thoracic aortic aneurysm or dissection.  There is focal consolidation in the lateral segment of the left lower lobe. There is a smaller area of consolidation in the lateral segment right middle lobe inferiorly. There is bibasilar atelectatic change. Lungs are otherwise clear.  Heart is mildly enlarged. There  is no appreciable pericardial effusion. There is no appreciable adenopathy. Small mediastinal lymph nodes are noted which do not meet size criteria for pathologic significance.  There is mild thickening of the wall of the distal esophagus.  Visualized upper abdominal structures appear normal. There are no blastic or lytic bone lesions. There is thoracic dextroscoliosis.  Review of the MIP images confirms the above findings.  IMPRESSION: There is patchy infiltrate in the bases, most notably in the lateral left base. No demonstrable pulmonary embolus. The heart is enlarged.   Electronically Signed   By: Bretta Bang M.D.   On: 04/05/2013 15:21    Medications: Scheduled Meds: . antipyrine-benzocaine  2 drop Left Ear Q4H  . cefpodoxime  200 mg Oral Q12H  . fluticasone  2 spray Each Nare BID  . heparin  5,000 Units Subcutaneous Q8H  . montelukast  10 mg Oral QHS  . olopatadine  1 drop Both Eyes BID  . pantoprazole  40 mg Oral BID  . sodium chloride   3 mL Intravenous Q12H  . tiZANidine  4 mg Oral TID      LOS: 1 day   RAI,RIPUDEEP M.D. [facility] 04/15/2013, 10:49 AM Pager: [phone]  If 7PM-7AM, please contact night-coverage [email or web address] Password [email or web address]

## 2013-04-15 NOTE — Progress Notes (Signed)
Late entry: Patient left AMA this morning at around 11:20 am.  Patient informed of the risk of leaving the hospital against the advice of attending physician; she acknowledged understanding of risk involved.  Dr. Isidoro Donning made aware. Patients daughter present at bedside.

## 2013-04-15 NOTE — Progress Notes (Addendum)
Pt c/o HA with left facial numbness.  Pt's daughter at bedside states that her mother is not "normal" and she does not understand why her mother is forgetting things.  Daughter states that mother is usually "on top of it." Performed neuro examination.  NP, Lenny Pastel, notified of pt's 10/10 pain, anxiousness and what the daughter stated.  Received order for 0.5mg  ativan for anxiety and 5-10mg  Oxycodone for pain.  Will continue to monitor pt's neuro status.

## 2013-04-15 NOTE — Discharge Summary (Signed)
AMA NOTE   Patient ID: Tara Conley MRN: [id number] DOB/AGE: 1950/07/19 62 y.o.  Admit date: 04/14/2013 Discharge date: 04/15/2013 Please note that patient left AMA  Primary Care Physician:  No PCP Per Patient  Discharge Diagnoses:    . HTN (hypertension), benign . Chronic back pain . Chest pain, unspecified . GERD (gastroesophageal reflux disease) . Numbness and tingling . Morbid obesity . Headache(784.0) . Bradycardia  Consults:  None     Allergies:   Allergies  Allergen Reactions  . Haloperidol And Related Swelling    Tongue swelling  . Penicillins Hives  . Nickel Rash     Discharge Medications: NONE AS PATIENT LEFT AMA         Brief H and P: For complete details please refer to admission H and P, but in briefAntonia Conley is a 62 y.o. female with pmh significant for chronic back pain, seasonal allergies/allergic rhinitis/asthma; obesity, HTN and GERD; came to ED complaining of Headache's and dizziness. Patient also complaining of epigastric/chest pain and back pain (this chronic). In ED blood work unremarkable and images studies from 04/13/13 unremarkable. Patient denied fever, chills, nausea, vomiting, palpitations, blurred vision, focal deficit or any complaints. Due to chest pain, bradycardia and soft BP TR[facility] was asked to admit patient for further evaluation and treatment   Hospital Course:  Chest pain, unspecified -Patient is a very vague historian, at the time of my encounter this morning, she stated that she did not have any chest pain at the time of admission however she has been ruled out for acute ACS with negative troponins.  D-dimer was somewhat elevated, CT angiogram of the chest was ordered to rule out PE, still pending at the time when she left AMA.  - EKG showed rate of 49, sinus bradycardia otherwise no acute ST-T wave changes. 2-D echo was done which showed EF of 65-70%, normal wall motion, grade 1 diastolic dysfunction  Left ear pain :  At  the time of my encounter this morning, patient was claiming eft ear pain, no ear discharge noted with the otoscope or any bulging tympanic membrane, but patient had significant pain.She was placed on Auralgan ear drops and Vantin however patient left AMA.   HTN (hypertension), benign: Elevated likely due to anxiety and pain issues.   Acute on Chronic back pain: In lumbar region, radiating to right leg  I ordered MRI of the lumbar spine to rule out any nerve root compression, continue pain control however patient did not want to stay for the MRI of the lumbar spine either, she left AMA   Headache with left facial numbness: Resolved Patient had complained of headache with left facial numbness at night, NP had discussed with neurologist on call Dr. Thad Ranger, so recommended metabolic/vasculitis workup. patient had no focal neurological deficits.  At the time of my examination, patient did not complain of any headache or facial numbness or tingling, has no FND's, only complaining of severe left ear pain, back pain and right leg pain. Patient left AMA before the workup was complete.      The results of significant diagnostics from this hospitalization (including imaging, microbiology, ancillary and laboratory) are listed below for reference.    LAB RESULTS: Basic Metabolic Panel:  Recent Labs Lab 04/14/13 1209 04/14/13 1814 04/15/13 0500  NA 134*  --  139  K 3.5  --  4.3  CL 100  --  106  CO2 21  --  19  GLUCOSE 148*  --  106*  BUN 18  --  18  CREATININE 0.76 0.82 0.88  CALCIUM 9.5  --  9.3   Liver Function Tests:  Recent Labs Lab 04/10/13 2045 04/14/13 1209  AST 20 13  ALT 11 6  ALKPHOS 107 90  BILITOT 0.8 0.7  PROT 8.1 6.9  ALBUMIN 4.4 3.4*    Recent Labs Lab 04/14/13 1814  LIPASE 18   No results found for this basename: AMMONIA,  in the last 168 hours CBC:  Recent Labs Lab 04/14/13 1209 04/14/13 1814 04/15/13 0500  WBC 10.8* 9.6 12.8*  NEUTROABS 7.3  --   --    HGB 14.6 14.4 14.7  HCT 44.1 45.7 45.4  MCV 85.1 87.2 87.0  PLT 290 287 265   Cardiac Enzymes:  Recent Labs Lab 04/15/13 0040 04/15/13 0500  TROPONINI <0.30 <0.30   BNP: No components found with this basename: POCBNP,  CBG:  Recent Labs Lab 04/14/13 1223  GLUCAP 136*    Significant Diagnostic Studies:  Ct Head Wo Contrast  04/14/2013   CLINICAL DATA:  Headache, numbness  EXAM: CT HEAD WITHOUT CONTRAST  TECHNIQUE: Contiguous axial images were obtained from the base of the skull through the vertex without intravenous contrast.  COMPARISON:  11/13/2012  FINDINGS: No skull fracture is noted. Paranasal sinuses and mastoid air cells are unremarkable.  No intracranial hemorrhage, mass effect or midline shift.  No acute infarction. No mass lesion is noted on this unenhanced scan. The gray and white-matter differentiation is preserved. No intra or extra-axial fluid collection.  IMPRESSION: No acute intracranial abnormality. No significant change.   Electronically Signed   By: Tara Conley M.D.   On: 04/14/2013 12:47    2D ECHO:  Study Conclusions  - Left ventricle: The cavity size was normal. There was mild concentric hypertrophy. Systolic function was vigorous. The estimated ejection fraction was in the range of 65% to 70%. Wall motion was normal; there were no regional wall motion abnormalities. Doppler parameters are consistent with abnormal left ventricular relaxation (grade 1 diastolic dysfunction). Doppler parameters are consistent with high ventricular filling pressure. - Aortic valve: Mildly calcified annulus. Trileaflet. No significant regurgitation. - Mitral valve: Trivial regurgitation. - Left atrium: The atrium was at the upper limits of normal in size. - Right atrium: Central venous pressure: 3mm Hg (est). - Tricuspid valve: Trivial regurgitation. - Pulmonary arteries: PA peak pressure: 9mm Hg (S). - Pericardium, extracardiac: There was no  pericardial effusion.    Disposition and Follow-up:      Future Appointments Provider Department Dept Phone   05/15/2013 10:30 AM [name], MD [facility] [phone]       DISPOSITION: PATIENT LEFT AMA  DISCHARGE FOLLOW-UP None as patient left AMA   Signed:   RAI,RIPUDEEP M.D. [facility] 04/15/2013, 3:00 PM Pager: [phone]

## 2013-04-15 NOTE — Progress Notes (Signed)
Utilization Review completed.  

## 2013-04-16 ENCOUNTER — Emergency Department (HOSPITAL_COMMUNITY): Payer: 59

## 2013-04-16 ENCOUNTER — Encounter (HOSPITAL_COMMUNITY): Payer: Self-pay | Admitting: Emergency Medicine

## 2013-04-16 ENCOUNTER — Emergency Department (HOSPITAL_COMMUNITY)
Admission: EM | Admit: 2013-04-16 | Discharge: 2013-04-16 | Disposition: A | Discharge status: Home / Self Care | Payer: 59 | Attending: Emergency Medicine | Admitting: Emergency Medicine

## 2013-04-16 DIAGNOSIS — Z8701 Personal history of pneumonia (recurrent): Secondary | ICD-10-CM | POA: Insufficient documentation

## 2013-04-16 DIAGNOSIS — IMO0002 Reserved for concepts with insufficient information to code with codable children: Secondary | ICD-10-CM | POA: Insufficient documentation

## 2013-04-16 DIAGNOSIS — I10 Essential (primary) hypertension: Secondary | ICD-10-CM | POA: Insufficient documentation

## 2013-04-16 DIAGNOSIS — Z880 Allergy status to penicillin: Secondary | ICD-10-CM | POA: Insufficient documentation

## 2013-04-16 DIAGNOSIS — R000 Tachycardia, unspecified: Secondary | ICD-10-CM | POA: Insufficient documentation

## 2013-04-16 DIAGNOSIS — Z859 Personal history of malignant neoplasm, unspecified: Secondary | ICD-10-CM | POA: Insufficient documentation

## 2013-04-16 DIAGNOSIS — G8929 Other chronic pain: Secondary | ICD-10-CM | POA: Insufficient documentation

## 2013-04-16 DIAGNOSIS — R079 Chest pain, unspecified: Secondary | ICD-10-CM | POA: Insufficient documentation

## 2013-04-16 DIAGNOSIS — E669 Obesity, unspecified: Secondary | ICD-10-CM | POA: Insufficient documentation

## 2013-04-16 DIAGNOSIS — M549 Dorsalgia, unspecified: Secondary | ICD-10-CM | POA: Insufficient documentation

## 2013-04-16 DIAGNOSIS — Z79899 Other long term (current) drug therapy: Secondary | ICD-10-CM | POA: Insufficient documentation

## 2013-04-16 LAB — POCT I-STAT, CHEM 8
Calcium, Ion: 1.25 mmol/L (ref 1.13–1.30)
Chloride: 106 mEq/L (ref 96–112)
Creatinine, Ser: 0.9 mg/dL (ref 0.50–1.10)
Glucose, Bld: 165 mg/dL — ABNORMAL HIGH (ref 70–99)
HCT: 49 % — ABNORMAL HIGH (ref 36.0–46.0)
Hemoglobin: 16.7 g/dL — ABNORMAL HIGH (ref 12.0–15.0)
Potassium: 3.2 mEq/L — ABNORMAL LOW (ref 3.5–5.1)
Sodium: 143 mEq/L (ref 135–145)

## 2013-04-16 LAB — CBC
HCT: 45.5 % (ref 36.0–46.0)
MCH: 28 pg (ref 26.0–34.0)
MCHC: 33.2 g/dL (ref 30.0–36.0)
RBC: 5.39 MIL/uL — ABNORMAL HIGH (ref 3.87–5.11)
RDW: 16.3 % — ABNORMAL HIGH (ref 11.5–15.5)

## 2013-04-16 LAB — POCT I-STAT TROPONIN I

## 2013-04-16 MED ORDER — NITROGLYCERIN 0.4 MG SL SUBL
SUBLINGUAL_TABLET | SUBLINGUAL | Status: AC
  Administered 2013-04-16: 0.4 mg via SUBLINGUAL
  Filled 2013-04-16: qty 25

## 2013-04-16 MED ORDER — ASPIRIN 81 MG PO CHEW
324.0000 mg | CHEWABLE_TABLET | Freq: Once | ORAL | Status: AC
  Administered 2013-04-16: 324 mg via ORAL
  Filled 2013-04-16: qty 4

## 2013-04-16 MED ORDER — NITROGLYCERIN 0.4 MG SL SUBL
0.4000 mg | SUBLINGUAL_TABLET | SUBLINGUAL | Status: AC | PRN
  Administered 2013-04-16 (×3): 0.4 mg via SUBLINGUAL

## 2013-04-16 NOTE — ED Notes (Signed)
Pt stated that I fixed her chest pain and that she was ready to go home.  Pt denies chest pain.

## 2013-04-16 NOTE — ED Notes (Signed)
[facility] presents with a 62 yo female from home with chest pain.  Pt c/o chest tightness and sharp pain suddenly while preparing to go out this morning.  Pt c/o pain with movement and SOB.  12 lead unremarkable.  Hx. Of HTN.  Pt takes Hctz.

## 2013-04-16 NOTE — ED Provider Notes (Signed)
CSN: [id number]     Arrival date & time 04/16/13  1118 History   First MD Initiated Contact with Patient 04/16/13 1127     Chief Complaint  Patient presents with  . Chest Pain   (Consider location/radiation/quality/duration/timing/severity/associated sxs/prior Treatment) HPI Comments: Tara Conley is a 62 y.o. Female who presents for evaluation of chest discomfort. The chest discomfort occurred earlier today and has since resolved. Patient was hospitalized yesterday, and left AGAINST MEDICAL ADVICE prior to completion of her evaluation for chest pain and low back pain. She had completed a rule out for MI. After leaving against medical device. She went to another outlying emergency department and was evaluated and discharged. She states that she does not have a primary care doctor. She has chronic low back pain and sees a doctor in [place] for that. She denies fever, chills, nausea, vomiting, weakness, or dizziness. There are no other known modifying factors.  Patient is a 62 y.o. female presenting with chest pain. The history is provided by the patient.  Chest Pain   Past Medical History  Diagnosis Date  . Back pain, chronic   . Herniated disc   . Bulging disc   . Pneumonia   . Seasonal allergies   . Hypertension   . Cancer    Past Surgical History  Procedure Laterality Date  . Abdominal hysterectomy    . Knee surgery    . Hand surgery    . Tonsillectomy     No family history on file. History  Substance Use Topics  . Smoking status: Never Smoker   . Smokeless tobacco: Never Used  . Alcohol Use: No   OB History   Grav Para Term Preterm Abortions TAB SAB Ect Mult Living                 Review of Systems  Cardiovascular: Positive for chest pain.  All other systems reviewed and are negative.    Allergies  Haloperidol and related; Penicillins; and Nickel  Home Medications   Current Outpatient Rx  Name  Route  Sig  Dispense  Refill  . Bepotastine Besilate 1.5  % SOLN   Both Eyes   Place 2 drops into both eyes daily.          . fluticasone (FLONASE) 50 MCG/ACT nasal spray   Nasal   Place 2 sprays into the nose 2 (two) times daily.         . hydrochlorothiazide (HYDRODIURIL) 25 MG tablet   Oral   Take 25 mg by mouth daily.         Marland Kitchen HYDROcodone-acetaminophen (NORCO/VICODIN) 5-325 MG per tablet   Oral   Take 1 tablet by mouth every 4 (four) hours as needed for severe pain.         Marland Kitchen ibuprofen (ADVIL,MOTRIN) 200 MG tablet   Oral   Take 600 mg by mouth every 4 (four) hours as needed for mild pain or moderate pain.         Marland Kitchen tiZANidine (ZANAFLEX) 4 MG tablet   Oral   Take 4 mg by mouth 2 (two) times daily.          Marland Kitchen albuterol (PROVENTIL HFA;VENTOLIN HFA) 108 (90 BASE) MCG/ACT inhaler   Inhalation   Inhale 1-2 puffs into the lungs every 6 (six) hours as needed for wheezing.   1 Inhaler   0   . promethazine (PHENERGAN) 25 MG tablet   Oral   Take 25 mg by mouth every  6 (six) hours as needed for nausea.          BP 147/80  Pulse 85  Temp(Src) 98.6 F (37 C) (Oral)  Resp 16  SpO2 96% Physical Exam  Nursing note and vitals reviewed. Constitutional: She is oriented to person, place, and time. She appears well-developed.  Obese, appears older than stated age  HENT:  Head: Normocephalic and atraumatic.  Eyes: Conjunctivae and EOM are normal. Pupils are equal, round, and reactive to light.  Neck: Normal range of motion and phonation normal. Neck supple.  Cardiovascular:  Tachycardia  Pulmonary/Chest: Effort normal. She exhibits no tenderness.  Abdominal: Soft. She exhibits distension.  Musculoskeletal: Normal range of motion.  Neurological: She is alert and oriented to person, place, and time. She exhibits normal muscle tone.  Skin: Skin is warm and dry.  Psychiatric: She has a normal mood and affect. Her behavior is normal. Judgment and thought content normal.    ED Course  Procedures (including critical care  time)      Date: 04/16/13  Rate: 111  Rhythm: sinus tachycardia  QRS Axis: normal  PR and QT Intervals: normal  ST/T Wave abnormalities: normal  PR and QRS Conduction Disutrbances:left anterior fascicular block  Narrative Interpretation:   Old EKG Reviewed: changes noted- rate faster    Labs Review Labs Reviewed  CBC - Abnormal; Notable for the following:    WBC 10.8 (*)    RBC 5.39 (*)    Hemoglobin 15.1 (*)    RDW 16.3 (*)    All other components within normal limits  POCT I-STAT, CHEM 8 - Abnormal; Notable for the following:    Potassium 3.2 (*)    BUN 5 (*)    Glucose, Bld 165 (*)    Hemoglobin 16.7 (*)    HCT 49.0 (*)    All other components within normal limits  URINALYSIS, ROUTINE W REFLEX MICROSCOPIC  POCT I-STAT TROPONIN I   Imaging Review Dg Chest 2 View  04/16/2013   CLINICAL DATA:  Chest pain.  EXAM: CHEST  2 VIEW  COMPARISON:  04/10/2013.  FINDINGS: Mediastinum and hilar structures are stable. Changes of scarring and or atelectasis again noted in the lung bases. No effusion or pneumothorax. Stable cardiomegaly with normal pulmonary vascularity. Degenerative changes both shoulders and thoracic spine.  IMPRESSION: Stable chest with changes of scarring and atelectasis .   Electronically Signed   By: Maisie Fus  Register   On: 04/16/2013 12:13    EKG Interpretation   None       MDM   1. Chest pain     Nonspecific pain with recent admission for evaluation of cardiac disease. This evaluation was negative. She also has chronic low back pain. There is no evidence for ACS, PE, pneumonia, acute lumbar radiculopathy, metabolic instability, or impending vascular collapse. Her heart rate is improved as of today, compared with the prior bradycardia, which required her recent hospital admission. She is stable for discharge with outpatient management.  Nursing Notes Reviewed/ Care Coordinated, and agree without changes. Applicable Imaging Reviewed.  Interpretation of  Laboratory Data incorporated into ED treatment   Plan: Home Medications- usual; Home Treatments and Observation- rest; return here if the recommended treatment, does not improve the symptoms; Recommended follow up- PCP, when necessary     Flint Melter, MD 04/16/13 [phone]

## 2013-04-16 NOTE — ED Notes (Signed)
Abran Richard (daughter) [phone] called to let us know that she is completely out of her pain medication and she feels that her mother is drug seeking.  She stated that her mother was seen last night at [facility] for back pain; however, they didn't give her anything.  Upon release from [facility] ED, mother became violently ill (vomiting) and c/o confusion.  She feels her mother is addicted to prescription pain meds.  Daughter's phone number is above if needed for contact.

## 2013-04-16 NOTE — ED Notes (Signed)
Pt refused acetaminophen despite c/o back pain and headache; stated "it won't do no good anyway".

## 2013-04-16 NOTE — ED Notes (Signed)
Pt  Is leaving unit for xray; pt sitting up in bed and a lot more verbal than when initially brought in by [facility]; pt c/o abdominal pain and back pain more than chest pain now.  Pt has hx of back issues in which she stated she was discharged two days ago from hospital for back issues.

## 2013-04-21 LAB — HEAVY METALS, RANDOM URINE

## 2013-04-24 LAB — HEAVY METALS SCREEN, URINE

## 2013-05-01 ENCOUNTER — Emergency Department (HOSPITAL_COMMUNITY)
Admission: EM | Admit: 2013-05-01 | Discharge: 2013-05-01 | Disposition: A | Discharge status: Home / Self Care | Payer: 59 | Attending: Emergency Medicine | Admitting: Emergency Medicine

## 2013-05-01 ENCOUNTER — Encounter (HOSPITAL_COMMUNITY): Payer: Self-pay | Admitting: Emergency Medicine

## 2013-05-01 ENCOUNTER — Emergency Department (HOSPITAL_COMMUNITY): Payer: 59

## 2013-05-01 DIAGNOSIS — J309 Allergic rhinitis, unspecified: Secondary | ICD-10-CM | POA: Insufficient documentation

## 2013-05-01 DIAGNOSIS — G8929 Other chronic pain: Secondary | ICD-10-CM | POA: Insufficient documentation

## 2013-05-01 DIAGNOSIS — Z888 Allergy status to other drugs, medicaments and biological substances status: Secondary | ICD-10-CM | POA: Insufficient documentation

## 2013-05-01 DIAGNOSIS — F411 Generalized anxiety disorder: Secondary | ICD-10-CM | POA: Insufficient documentation

## 2013-05-01 DIAGNOSIS — I10 Essential (primary) hypertension: Secondary | ICD-10-CM | POA: Insufficient documentation

## 2013-05-01 DIAGNOSIS — IMO0002 Reserved for concepts with insufficient information to code with codable children: Secondary | ICD-10-CM | POA: Insufficient documentation

## 2013-05-01 DIAGNOSIS — Z79899 Other long term (current) drug therapy: Secondary | ICD-10-CM | POA: Insufficient documentation

## 2013-05-01 DIAGNOSIS — Z8701 Personal history of pneumonia (recurrent): Secondary | ICD-10-CM | POA: Insufficient documentation

## 2013-05-01 DIAGNOSIS — I498 Other specified cardiac arrhythmias: Secondary | ICD-10-CM | POA: Insufficient documentation

## 2013-05-01 DIAGNOSIS — R51 Headache: Secondary | ICD-10-CM | POA: Insufficient documentation

## 2013-05-01 DIAGNOSIS — M542 Cervicalgia: Secondary | ICD-10-CM | POA: Insufficient documentation

## 2013-05-01 DIAGNOSIS — Z880 Allergy status to penicillin: Secondary | ICD-10-CM | POA: Insufficient documentation

## 2013-05-01 DIAGNOSIS — Z859 Personal history of malignant neoplasm, unspecified: Secondary | ICD-10-CM | POA: Insufficient documentation

## 2013-05-01 DIAGNOSIS — M549 Dorsalgia, unspecified: Secondary | ICD-10-CM | POA: Insufficient documentation

## 2013-05-01 LAB — BASIC METABOLIC PANEL
BUN: 13 mg/dL (ref 6–23)
CO2: 23 mEq/L (ref 19–32)
Calcium: 8.9 mg/dL (ref 8.4–10.5)
Creatinine, Ser: 0.97 mg/dL (ref 0.50–1.10)
GFR calc non Af Amer: 62 mL/min — ABNORMAL LOW (ref >90)
Glucose, Bld: 141 mg/dL — ABNORMAL HIGH (ref 70–99)
Sodium: 139 mEq/L (ref 135–145)

## 2013-05-01 MED ORDER — KETOROLAC TROMETHAMINE 30 MG/ML IJ SOLN
30.0000 mg | Freq: Once | INTRAMUSCULAR | Status: AC
  Administered 2013-05-01: 30 mg via INTRAVENOUS
  Filled 2013-05-01: qty 1

## 2013-05-01 MED ORDER — LORAZEPAM 2 MG/ML IJ SOLN
1.0000 mg | Freq: Once | INTRAMUSCULAR | Status: AC
  Administered 2013-05-01: 1 mg via INTRAVENOUS
  Filled 2013-05-01: qty 1

## 2013-05-01 MED ORDER — FENTANYL CITRATE 0.05 MG/ML IJ SOLN
50.0000 ug | Freq: Once | INTRAMUSCULAR | Status: AC
  Administered 2013-05-01: 50 ug via INTRAVENOUS
  Filled 2013-05-01: qty 2

## 2013-05-01 MED ORDER — SODIUM CHLORIDE 0.9 % IV BOLUS (SEPSIS)
1000.0000 mL | Freq: Once | INTRAVENOUS | Status: AC
  Administered 2013-05-01: 1000 mL via INTRAVENOUS

## 2013-05-01 MED ORDER — IOHEXOL 350 MG/ML SOLN
80.0000 mL | Freq: Once | INTRAVENOUS | Status: AC | PRN
  Administered 2013-05-01: 80 mL via INTRAVENOUS

## 2013-05-01 NOTE — ED Notes (Signed)
Pt reports she is feeling better and ready to go home. MD made aware.

## 2013-05-01 NOTE — ED Provider Notes (Signed)
CSN: [id number]     Arrival date & time 05/01/13  4098 History   First MD Initiated Contact with Patient 05/01/13 [phone]     Chief Complaint  Patient presents with  . Headache  . Anxiety   HPI  Patient presents with concerns headache.  This episode began approximately one hour prior to my evaluation.  Patient states that she was sitting at her computer for some time, when she felt the subacute onset of pain from her posterior neck radiating over the top of her occiput.  Since onset the pain has been severe, constant, radiating anteriorly across the top of the scalp.  There is no no associated visual change, fall, weakness in any extremity, nausea, vomiting, fevers, chills, loss of consciousness. Patient endorses a headache history.  She states an inconsistent description of the actual onset, but it seems as though the headache was gradual in onset, patient did not take any medication for headache relief, choosing to come here instead.  Past Medical History  Diagnosis Date  . Back pain, chronic   . Herniated disc   . Bulging disc   . Pneumonia   . Seasonal allergies   . Hypertension   . Cancer    Past Surgical History  Procedure Laterality Date  . Abdominal hysterectomy    . Knee surgery    . Hand surgery    . Tonsillectomy     History reviewed. No pertinent family history. History  Substance Use Topics  . Smoking status: Never Smoker   . Smokeless tobacco: Never Used  . Alcohol Use: No   OB History   Grav Para Term Preterm Abortions TAB SAB Ect Mult Living                 Review of Systems  Constitutional:       Per HPI, otherwise negative  HENT:       Per HPI, otherwise negative  Respiratory:       Per HPI, otherwise negative  Cardiovascular:       Per HPI, otherwise negative  Gastrointestinal: Negative for vomiting.  Endocrine:       Negative aside from HPI  Genitourinary:       Neg aside from HPI   Musculoskeletal:       Per HPI, otherwise negative  Skin:  Negative.   Neurological: Positive for headaches. Negative for dizziness, tremors, seizures, syncope, facial asymmetry, speech difficulty, weakness, light-headedness and numbness.    Allergies  Haloperidol and related; Penicillins; and Nickel  Home Medications   Current Outpatient Rx  Name  Route  Sig  Dispense  Refill  . albuterol (PROVENTIL HFA;VENTOLIN HFA) 108 (90 BASE) MCG/ACT inhaler   Inhalation   Inhale 1-2 puffs into the lungs every 6 (six) hours as needed for wheezing.   1 Inhaler   0   . Bepotastine Besilate 1.5 % SOLN   Both Eyes   Place 2 drops into both eyes daily.          . fluticasone (FLONASE) 50 MCG/ACT nasal spray   Nasal   Place 2 sprays into the nose 2 (two) times daily.         . hydrochlorothiazide (HYDRODIURIL) 25 MG tablet   Oral   Take 25 mg by mouth daily.         Marland Kitchen HYDROcodone-acetaminophen (NORCO/VICODIN) 5-325 MG per tablet   Oral   Take 1 tablet by mouth every 4 (four) hours as needed for severe pain.         Marland Kitchen  ibuprofen (ADVIL,MOTRIN) 200 MG tablet   Oral   Take 600 mg by mouth every 4 (four) hours as needed for mild pain or moderate pain.         . promethazine (PHENERGAN) 25 MG tablet   Oral   Take 25 mg by mouth every 6 (six) hours as needed for nausea.         Marland Kitchen tiZANidine (ZANAFLEX) 4 MG tablet   Oral   Take 4 mg by mouth 2 (two) times daily.           BP 133/65  Temp(Src) 98 F (36.7 C) (Oral)  Resp 15  SpO2 94% Physical Exam  Nursing note and vitals reviewed. Constitutional: She is oriented to person, place, and time. She appears well-developed and well-nourished. No distress.  Uncomfortable appearing large female resting in a dark room.  HENT:  Head: Normocephalic and atraumatic.  Eyes: Conjunctivae and EOM are normal. Pupils are equal, round, and reactive to light. Right eye exhibits no discharge. Left eye exhibits no discharge. No scleral icterus.  Neck: Normal range of motion. Neck supple. Muscular  tenderness present. No tracheal deviation and normal range of motion present. No Kernig's sign noted. No thyromegaly present.  Cardiovascular: Normal rate and regular rhythm.   Pulmonary/Chest: Effort normal and breath sounds normal. No stridor. No respiratory distress.  Abdominal: She exhibits no distension.  Musculoskeletal: She exhibits no edema.  Neurological: She is alert and oriented to person, place, and time. She displays no atrophy and no tremor. No cranial nerve deficit or sensory deficit. She exhibits normal muscle tone. She displays no seizure activity. Coordination normal.  Patient with no cranial nerve deficits, no cerebellar deficits, no strength deficits, no sensory deficits.  Skin: Skin is warm and dry.  Psychiatric: Her speech is normal and behavior is normal. Judgment and thought content normal. Her mood appears anxious. Cognition and memory are normal.    ED Course  Procedures (including critical care time) Labs Review Labs Reviewed - No data to display Imaging Review No results found.  EKG Interpretation    Date/Time:  Tuesday May 01 2013 06:45:02 EST Ventricular Rate:  40 PR Interval:  183 QRS Duration: 88 QT Interval:  473 QTC Calculation: 386 R Axis:   -9 Text Interpretation:  Sinus bradycardia Probable left atrial enlargement Baseline wander in lead(s) V6 No significant change since last tracing Confirmed by OTTER  MD, OLGA ([id number]) on 05/01/2013 6:49:56 AM           I reviewed the patient's chart, including imaging studies.  Notably the patient has had TTC angiogram of her chest in the last month, to CTs of her head within the past 5 months.   8:29 AM Patient is more calm.  She continues to c/o severe pain.  She also remains bradycardic - this is a previously noted medical condition.   10:55 AM CT results reviewed with the patient.  She states that she feels somewhat better, though she continues to have posterior head and neck pain.  I  recommended lumbar puncture.  The patient deferred this recommendation.   11:45 AM Patient has resolved.  She requests discharge. MDM  No diagnosis found. This patient presents with new type of headache.  With the patient's description of head pain, neck pain, restriction of no lower extremity weakness and dysesthesia, there is some suspicion for cerebellar versus vascular lesion.  Patient has had multiple prior radiographic studies, but no prior description of a similar symptoms.  After  patient did not improve initially, she had radiographic studies performed, and I advocated for lumbar puncture.  Patient deferred this suggestion, after a lengthy conversation. Patient improved substantially with additional analgesics, requested discharge.  With resolution of symptoms, there is low suspicion for occult bleeding, given the timing of her presentation, the radiographic imaging, the reassuring labs.  She was discharged in stable condition to followup with her primary care physician.    Gerhard Munch, MD 05/01/13 1147

## 2013-05-01 NOTE — ED Notes (Signed)
Pt states that she was on the computer when she felt a "bubble" come over her head. She reports that pain came on gradually, after the "bubble" sensation. Pt states that she is home by herself this week, her husband is hunting.

## 2013-05-01 NOTE — ED Notes (Addendum)
Pt is extremely anxious. Pt called out saying "the whole head is hurting". This RN went into the room to check on the pt, and the pt is curled up in the bed, holding her head, and tearful. Norlene Campbell, MD is aware of the pt's distress.

## 2013-05-01 NOTE — ED Notes (Signed)
[place], MD at bedside.  

## 2013-05-01 NOTE — ED Notes (Signed)
Pt is reporting pain in her head. Pt states "it feels like a bubble". Pt states the pain came on suddenly, but also that it came on gradually. Pt appears anxious. NAD.

## 2013-05-01 NOTE — ED Notes (Signed)
Patient transported to CT 

## 2013-05-05 ENCOUNTER — Emergency Department (HOSPITAL_COMMUNITY)
Admission: EM | Admit: 2013-05-05 | Discharge: 2013-05-05 | Disposition: A | Discharge status: Home / Self Care | Payer: Medicare Other | Attending: Emergency Medicine | Admitting: Emergency Medicine

## 2013-05-05 ENCOUNTER — Emergency Department (HOSPITAL_COMMUNITY): Payer: Medicare Other

## 2013-05-05 ENCOUNTER — Encounter (HOSPITAL_COMMUNITY): Payer: Self-pay | Admitting: Emergency Medicine

## 2013-05-05 DIAGNOSIS — I10 Essential (primary) hypertension: Secondary | ICD-10-CM | POA: Insufficient documentation

## 2013-05-05 DIAGNOSIS — R61 Generalized hyperhidrosis: Secondary | ICD-10-CM | POA: Insufficient documentation

## 2013-05-05 DIAGNOSIS — R1013 Epigastric pain: Secondary | ICD-10-CM | POA: Diagnosis not present

## 2013-05-05 DIAGNOSIS — R0609 Other forms of dyspnea: Secondary | ICD-10-CM | POA: Insufficient documentation

## 2013-05-05 DIAGNOSIS — G8929 Other chronic pain: Secondary | ICD-10-CM | POA: Insufficient documentation

## 2013-05-05 DIAGNOSIS — F411 Generalized anxiety disorder: Secondary | ICD-10-CM | POA: Diagnosis not present

## 2013-05-05 DIAGNOSIS — Z79899 Other long term (current) drug therapy: Secondary | ICD-10-CM | POA: Diagnosis not present

## 2013-05-05 DIAGNOSIS — Z880 Allergy status to penicillin: Secondary | ICD-10-CM | POA: Insufficient documentation

## 2013-05-05 DIAGNOSIS — M549 Dorsalgia, unspecified: Secondary | ICD-10-CM | POA: Insufficient documentation

## 2013-05-05 DIAGNOSIS — R0682 Tachypnea, not elsewhere classified: Secondary | ICD-10-CM | POA: Insufficient documentation

## 2013-05-05 DIAGNOSIS — Z8701 Personal history of pneumonia (recurrent): Secondary | ICD-10-CM | POA: Diagnosis not present

## 2013-05-05 DIAGNOSIS — R0989 Other specified symptoms and signs involving the circulatory and respiratory systems: Secondary | ICD-10-CM | POA: Insufficient documentation

## 2013-05-05 DIAGNOSIS — R0602 Shortness of breath: Secondary | ICD-10-CM | POA: Insufficient documentation

## 2013-05-05 DIAGNOSIS — R112 Nausea with vomiting, unspecified: Secondary | ICD-10-CM | POA: Diagnosis not present

## 2013-05-05 DIAGNOSIS — Z859 Personal history of malignant neoplasm, unspecified: Secondary | ICD-10-CM | POA: Diagnosis not present

## 2013-05-05 DIAGNOSIS — R000 Tachycardia, unspecified: Secondary | ICD-10-CM | POA: Diagnosis not present

## 2013-05-05 DIAGNOSIS — R079 Chest pain, unspecified: Secondary | ICD-10-CM | POA: Diagnosis present

## 2013-05-05 DIAGNOSIS — R1012 Left upper quadrant pain: Secondary | ICD-10-CM | POA: Insufficient documentation

## 2013-05-05 LAB — COMPREHENSIVE METABOLIC PANEL
ALT: 12 U/L (ref 0–35)
AST: 15 U/L (ref 0–37)
Albumin: 4.1 g/dL (ref 3.5–5.2)
CO2: 20 mEq/L (ref 19–32)
Calcium: 9.6 mg/dL (ref 8.4–10.5)
GFR calc Af Amer: 67 mL/min — ABNORMAL LOW (ref >90)
Glucose, Bld: 156 mg/dL — ABNORMAL HIGH (ref 70–99)
Sodium: 143 mEq/L (ref 135–145)
Total Bilirubin: 0.5 mg/dL (ref 0.3–1.2)
Total Protein: 7.2 g/dL (ref 6.0–8.3)

## 2013-05-05 LAB — CBC WITH DIFFERENTIAL/PLATELET
Basophils Absolute: 0.1 10*3/uL (ref 0.0–0.1)
Basophils Relative: 1 % (ref 0–1)
Eosinophils Absolute: 0 10*3/uL (ref 0.0–0.7)
Eosinophils Relative: 0 % (ref 0–5)
HCT: 48.3 % — ABNORMAL HIGH (ref 36.0–46.0)
Lymphs Abs: 1.3 10*3/uL (ref 0.7–4.0)
MCH: 28.1 pg (ref 26.0–34.0)
MCHC: 34.2 g/dL (ref 30.0–36.0)
MCV: 82.3 fL (ref 78.0–100.0)
Neutrophils Relative %: 83 % — ABNORMAL HIGH (ref 43–77)
Platelets: 362 10*3/uL (ref 150–400)
RBC: 5.87 MIL/uL — ABNORMAL HIGH (ref 3.87–5.11)
RDW: 17.6 % — ABNORMAL HIGH (ref 11.5–15.5)

## 2013-05-05 LAB — D-DIMER, QUANTITATIVE: D-Dimer, Quant: 0.8 ug/mL-FEU — ABNORMAL HIGH (ref 0.00–0.48)

## 2013-05-05 MED ORDER — HYDROMORPHONE HCL PF 1 MG/ML IJ SOLN
0.5000 mg | Freq: Once | INTRAMUSCULAR | Status: AC
  Administered 2013-05-05: 0.5 mg via INTRAVENOUS
  Filled 2013-05-05: qty 1

## 2013-05-05 MED ORDER — LORAZEPAM 2 MG/ML IJ SOLN
1.0000 mg | Freq: Once | INTRAMUSCULAR | Status: AC
  Administered 2013-05-05: 1 mg via INTRAVENOUS
  Filled 2013-05-05 (×2): qty 1

## 2013-05-05 MED ORDER — IOHEXOL 350 MG/ML SOLN
100.0000 mL | Freq: Once | INTRAVENOUS | Status: AC | PRN
  Administered 2013-05-05: 100 mL via INTRAVENOUS

## 2013-05-05 MED ORDER — PROMETHAZINE HCL 25 MG/ML IJ SOLN
25.0000 mg | Freq: Once | INTRAMUSCULAR | Status: AC
  Administered 2013-05-05: 25 mg via INTRAVENOUS
  Filled 2013-05-05: qty 1

## 2013-05-05 MED ORDER — LABETALOL HCL 5 MG/ML IV SOLN
10.0000 mg | INTRAVENOUS | Status: DC | PRN
  Administered 2013-05-05: 10 mg via INTRAVENOUS
  Filled 2013-05-05: qty 4

## 2013-05-05 MED ORDER — LORAZEPAM 1 MG PO TABS: 1.0000 mg | ORAL_TABLET | Freq: Three times a day (TID) | ORAL | Status: AC | PRN

## 2013-05-05 MED ORDER — SODIUM CHLORIDE 0.9 % IV BOLUS (SEPSIS)
1000.0000 mL | Freq: Once | INTRAVENOUS | Status: AC
  Administered 2013-05-05: 1000 mL via INTRAVENOUS

## 2013-05-05 NOTE — ED Provider Notes (Signed)
CSN: [id number]     Arrival date & time 05/05/13  1126 History   First MD Initiated Contact with Patient 05/05/13 1128     Chief Complaint  Patient presents with  . Abdominal Pain  . Chest Pain  . Back Pain  . Shortness of Breath  . Nausea  . Emesis    HPI  Patient presents with concerns of dyspnea, back pain, chest pain, headache, epigastric pain. Patient notes that she was in her usual state of health prior to the onset of symptoms. I took care of this patient one week ago.  She states that since that encounter she has been doing generally well, with no ongoing neck pain, headache or any complaints until today. She states that soon after awakening she felt the onset of symptoms.  The pain is sore, severe.  No relief with anything. No syncope, near syncope. No fevers, chills. No sick contacts, no falls. No confusion, no disorientation.   Past Medical History  Diagnosis Date  . Back pain, chronic   . Herniated disc   . Bulging disc   . Pneumonia   . Seasonal allergies   . Hypertension   . Cancer    Past Surgical History  Procedure Laterality Date  . Abdominal hysterectomy    . Knee surgery    . Hand surgery    . Tonsillectomy     No family history on file. History  Substance Use Topics  . Smoking status: Never Smoker   . Smokeless tobacco: Never Used  . Alcohol Use: No   OB History   Grav Para Term Preterm Abortions TAB SAB Ect Mult Living                 Review of Systems  Constitutional:       Per HPI, otherwise negative  HENT:       Per HPI, otherwise negative  Respiratory:       Per HPI, otherwise negative  Cardiovascular:       Per HPI, otherwise negative  Gastrointestinal: Negative for vomiting.  Endocrine:       Negative aside from HPI  Genitourinary:       Neg aside from HPI   Musculoskeletal:       Per HPI, otherwise negative  Skin: Negative.   Neurological: Negative for syncope.    Allergies  Haloperidol and related; Penicillins;  and Nickel  Home Medications   Current Outpatient Rx  Name  Route  Sig  Dispense  Refill  . albuterol (PROVENTIL HFA;VENTOLIN HFA) 108 (90 BASE) MCG/ACT inhaler   Inhalation   Inhale 1-2 puffs into the lungs every 6 (six) hours as needed for wheezing.   1 Inhaler   0   . Bepotastine Besilate 1.5 % SOLN   Both Eyes   Place 2 drops into both eyes daily.          . fluticasone (FLONASE) 50 MCG/ACT nasal spray   Nasal   Place 2 sprays into the nose 2 (two) times daily.         . hydrochlorothiazide (HYDRODIURIL) 25 MG tablet   Oral   Take 25 mg by mouth daily.         Marland Kitchen HYDROcodone-acetaminophen (NORCO/VICODIN) 5-325 MG per tablet   Oral   Take 1 tablet by mouth every 4 (four) hours as needed for severe pain.         Marland Kitchen ibuprofen (ADVIL,MOTRIN) 200 MG tablet   Oral   Take 600  mg by mouth every 4 (four) hours as needed for mild pain or moderate pain.         . promethazine (PHENERGAN) 25 MG tablet   Oral   Take 25 mg by mouth every 6 (six) hours as needed for nausea.         Marland Kitchen tiZANidine (ZANAFLEX) 4 MG tablet   Oral   Take 4 mg by mouth 2 (two) times daily.           BP 143/87  Pulse 132  Temp(Src) 98.7 F (37.1 C) (Oral)  Resp 22  SpO2 95% Physical Exam  Nursing note and vitals reviewed. Constitutional: She is oriented to person, place, and time. She appears well-developed and well-nourished. She appears distressed.  Uncomfortable appearing elderly appearing female  HENT:  Head: Normocephalic and atraumatic.  Eyes: Conjunctivae and EOM are normal. Pupils are equal, round, and reactive to light.  Neck: Neck supple. No tracheal deviation present.  No neck pain, no limits of range of motion.  Cardiovascular: Regular rhythm.  Tachycardia present.   Pulmonary/Chest: Breath sounds normal. Tachypnea noted. She has no wheezes.  Abdominal: She exhibits no distension. There is tenderness in the epigastric area and left upper quadrant.  Musculoskeletal: She  exhibits no edema.  Neurological: She is alert and oriented to person, place, and time. No cranial nerve deficit.  Skin: Skin is warm. She is diaphoretic.  Psychiatric: Her mood appears anxious.    ED Course  Procedures (including critical care time) Labs Review Labs Reviewed  CBC WITH DIFFERENTIAL - Abnormal; Notable for the following:    WBC 10.8 (*)    RBC 5.87 (*)    Hemoglobin 16.5 (*)    HCT 48.3 (*)    RDW 17.6 (*)    Neutrophils Relative % 83 (*)    Neutro Abs 9.0 (*)    All other components within normal limits  D-DIMER, QUANTITATIVE - Abnormal; Notable for the following:    D-Dimer, Quant 0.80 (*)    All other components within normal limits  COMPREHENSIVE METABOLIC PANEL  LIPASE, BLOOD  TROPONIN I  URINALYSIS, ROUTINE W REFLEX MICROSCOPIC   Imaging Review Dg Chest Port 1 View  05/05/2013   CLINICAL DATA:  Chest pain.  EXAM: PORTABLE CHEST - 1 VIEW  COMPARISON:  04/16/2013.  FINDINGS: Mediastinal and cardiovascular structures are stable. Normal pulmonary vascularity. Changes of atelectasis and/or scarring again noted. The lung bases. No acute abnormality. Chest unchanged from prior study.  IMPRESSION: Stable chest with mild changes of atelectasis and/or scarring in the lung bases. Stable cardiomegaly.   Electronically Signed   By: Maisie Fus  Register   On: 05/05/2013 12:19    EKG Interpretation    Date/Time:  Saturday May 05 2013 11:50:04 EST Ventricular Rate:  126 PR Interval:  130 QRS Duration: 87 QT Interval:  333 QTC Calculation: 482 R Axis:   -47 Text Interpretation:  Sinus tachycardia Probable left atrial enlargement Left anterior fascicular block Abnormal R-wave progression, late transition Minimal ST depression, lateral leads Artifact in lead(s) I III aVL V2 and baseline wander in lead(s) V6 Sinus tachycardia Artifact ST-t wave abnormality Abnormal ekg Confirmed by Gerhard Munch  MD ([id number]) on 05/05/2013 12:09:05 PM           cardiac 131  abnormal Pulse ox 97% [place], abnormal   Update: Following initial evaluation the patient improved somewhat, though with elevated d-dimer, ongoing hypertension, tachycardia, concern for dissection remains.  After repeat evaluation, and discussion about risks and  benefits of radiation exposure, contrast dye exposure given her recent evaluations, the patient elected to proceed.  I counseled her on the need to minimize further exposure of today's evaluation were reassuring.   3:17 PM Patient states "I'm ready to go home."  I reassured her the importance of primary care followup, and again we discussed all results both from today and earlier this month. I reiterated the importance of careful monitoring, and consideration of further exposure to radiation and IV dye given the pleural evaluation she has had over the past weeks. MDM   1. Chest pain    this patient presents for second time in one week, and for the fifth time this month, now with chest pain, back pain, nausea, vomiting.  Her description of pain radiating from front to back, her nausea, vomiting, tachycardia, hypertension, dissection was a suspicion.  With elevated d-dimer, patient had radiographic studies performed.  These were reassuring, and the patient's symptoms resolved entirely.  Patient's vital signs also improved.  After a lengthy conversation on the need for primary care followup, close monitoring, and careful consideration if additional radiation closure and/or use of IV dye were acquired, the patient was discharged in a stable condition to follow up with her primary care physician.     Gerhard Munch, MD 05/05/13 [phone]

## 2013-05-05 NOTE — ED Notes (Signed)
Pt. Pushed call bell and noted to "need help with her nose bleed."  When in the room pt. States, "my nose isn't bleeding can you help me unstop it."  Pt. Encouraged to use her box of tissues to blow her nose.

## 2013-05-05 NOTE — ED Notes (Signed)
Pt. Has c/o SOB and epigastric pain that started at 10:30am. Pt.has c/o vomiting and back pain. Pt. Denies and sick contacts or injuries.

## 2013-05-15 ENCOUNTER — Institutional Professional Consult (permissible substitution): Payer: 59 | Admitting: Pulmonary Disease

## 2013-06-07 DEATH — deceased
